# Patient Record
Sex: Male | Born: 1980 | Race: Black or African American | Hispanic: No | Smoking: Never smoker
Health system: Southern US, Community
[De-identification: ages and names within clinical notes are randomized; demographics above are authoritative.]

## PROBLEM LIST (undated history)

## (undated) DIAGNOSIS — K219 Gastro-esophageal reflux disease without esophagitis: Secondary | ICD-10-CM

## (undated) HISTORY — DX: Gastro-esophageal reflux disease without esophagitis: K21.9

---

## 1999-03-30 ENCOUNTER — Encounter: Payer: Self-pay | Admitting: Emergency Medicine

## 1999-03-30 ENCOUNTER — Emergency Department (HOSPITAL_COMMUNITY): Admission: EM | Admit: 1999-03-30 | Discharge: 1999-03-30 | Payer: Self-pay | Admitting: Emergency Medicine

## 2000-07-30 ENCOUNTER — Emergency Department (HOSPITAL_COMMUNITY): Admission: EM | Admit: 2000-07-30 | Discharge: 2000-07-30 | Payer: Self-pay | Admitting: Emergency Medicine

## 2001-01-11 ENCOUNTER — Emergency Department (HOSPITAL_COMMUNITY): Admission: EM | Admit: 2001-01-11 | Discharge: 2001-01-11 | Payer: Self-pay

## 2007-01-18 ENCOUNTER — Emergency Department (HOSPITAL_COMMUNITY): Admission: EM | Admit: 2007-01-18 | Discharge: 2007-01-18 | Payer: Self-pay | Admitting: Family Medicine

## 2007-09-30 ENCOUNTER — Emergency Department (HOSPITAL_COMMUNITY): Admission: EM | Admit: 2007-09-30 | Discharge: 2007-09-30 | Payer: Self-pay | Admitting: Family Medicine

## 2009-06-23 ENCOUNTER — Ambulatory Visit: Payer: Self-pay | Admitting: Internal Medicine

## 2009-06-24 DIAGNOSIS — K219 Gastro-esophageal reflux disease without esophagitis: Secondary | ICD-10-CM

## 2009-06-24 DIAGNOSIS — J45909 Unspecified asthma, uncomplicated: Secondary | ICD-10-CM | POA: Insufficient documentation

## 2009-06-28 ENCOUNTER — Telehealth: Payer: Self-pay | Admitting: Internal Medicine

## 2009-08-02 ENCOUNTER — Ambulatory Visit: Payer: Self-pay | Admitting: Internal Medicine

## 2009-09-14 ENCOUNTER — Encounter: Payer: Self-pay | Admitting: Internal Medicine

## 2009-09-20 ENCOUNTER — Ambulatory Visit: Payer: Self-pay | Admitting: Internal Medicine

## 2009-09-20 DIAGNOSIS — M549 Dorsalgia, unspecified: Secondary | ICD-10-CM | POA: Insufficient documentation

## 2009-10-22 ENCOUNTER — Ambulatory Visit: Payer: Self-pay | Admitting: Internal Medicine

## 2009-10-22 ENCOUNTER — Encounter: Payer: Self-pay | Admitting: Internal Medicine

## 2009-10-25 ENCOUNTER — Telehealth: Payer: Self-pay | Admitting: Internal Medicine

## 2009-10-26 ENCOUNTER — Encounter: Admission: RE | Admit: 2009-10-26 | Discharge: 2009-11-24 | Payer: Self-pay | Admitting: Internal Medicine

## 2009-10-28 ENCOUNTER — Encounter: Payer: Self-pay | Admitting: Internal Medicine

## 2009-11-24 ENCOUNTER — Encounter: Payer: Self-pay | Admitting: Internal Medicine

## 2009-11-26 ENCOUNTER — Ambulatory Visit: Payer: Self-pay | Admitting: Internal Medicine

## 2010-03-18 ENCOUNTER — Emergency Department (HOSPITAL_COMMUNITY): Admission: EM | Admit: 2010-03-18 | Discharge: 2010-03-18 | Payer: Self-pay | Admitting: Emergency Medicine

## 2010-03-21 ENCOUNTER — Telehealth: Payer: Self-pay | Admitting: Internal Medicine

## 2010-03-21 ENCOUNTER — Ambulatory Visit: Payer: Self-pay | Admitting: Internal Medicine

## 2010-03-31 ENCOUNTER — Ambulatory Visit: Payer: Self-pay | Admitting: Internal Medicine

## 2010-04-01 ENCOUNTER — Ambulatory Visit: Payer: Self-pay | Admitting: Internal Medicine

## 2010-04-11 ENCOUNTER — Telehealth (INDEPENDENT_AMBULATORY_CARE_PROVIDER_SITE_OTHER): Payer: Self-pay | Admitting: *Deleted

## 2010-08-08 ENCOUNTER — Ambulatory Visit: Payer: Self-pay | Admitting: Internal Medicine

## 2010-08-08 ENCOUNTER — Telehealth: Payer: Self-pay | Admitting: Internal Medicine

## 2010-09-15 IMAGING — CR DG LUMBAR SPINE COMPLETE 4+V
5 series · 5 of 5 positions shown · non-contrast
Comparison: 09/30/2007.

CLINICAL DATA: 27-year-old male with low back pain.

LUMBAR SPINE - COMPLETE 4+ VIEW

[view not recorded (1 of 5)]
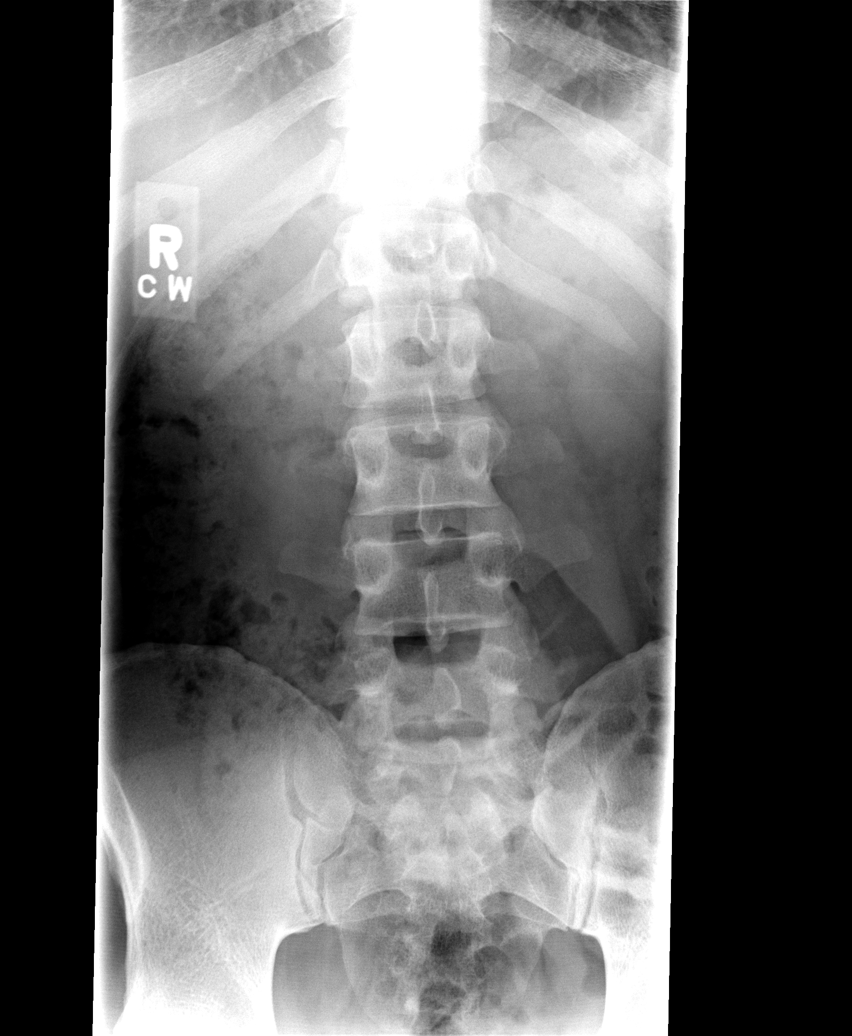

[view not recorded (2 of 5)]
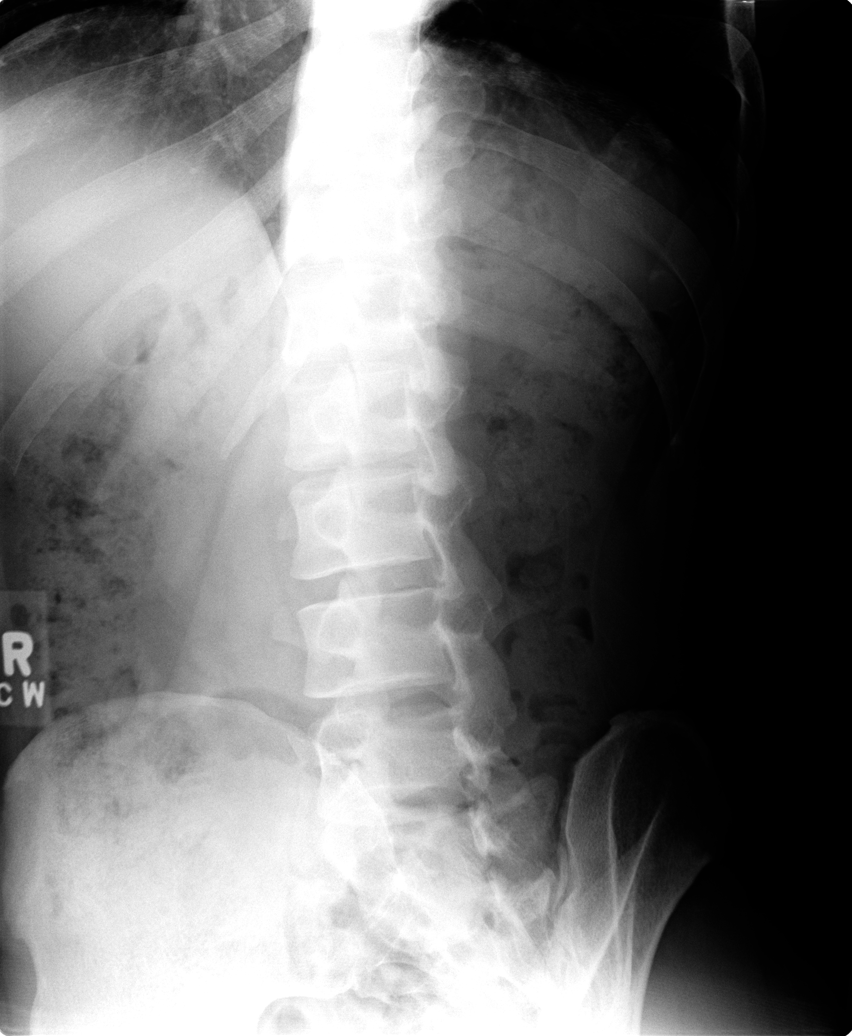

[view not recorded (3 of 5)]
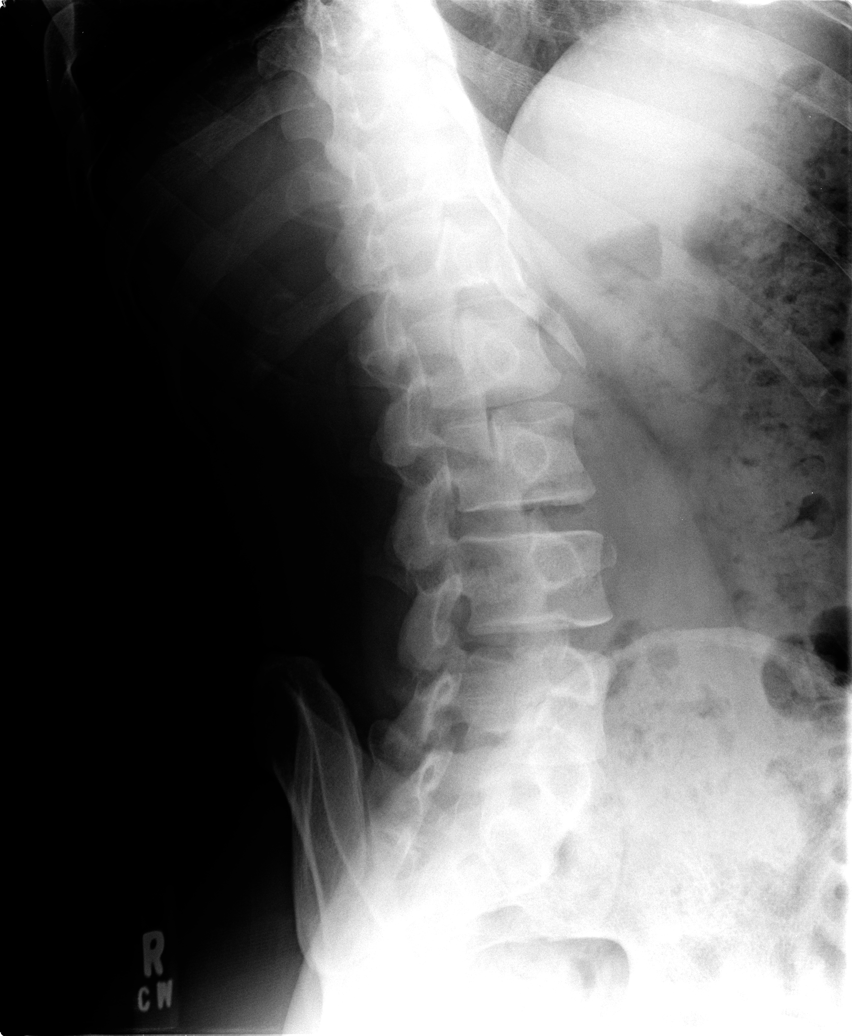

[view not recorded (4 of 5)]
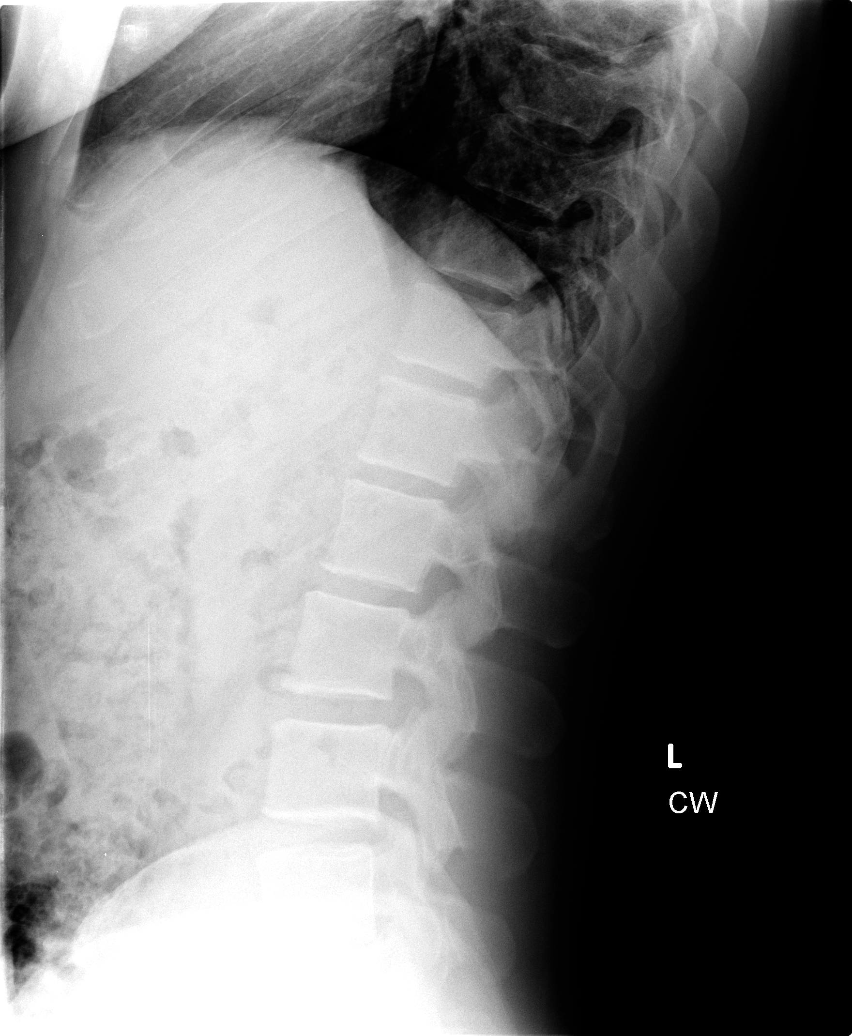

[view not recorded (5 of 5)]
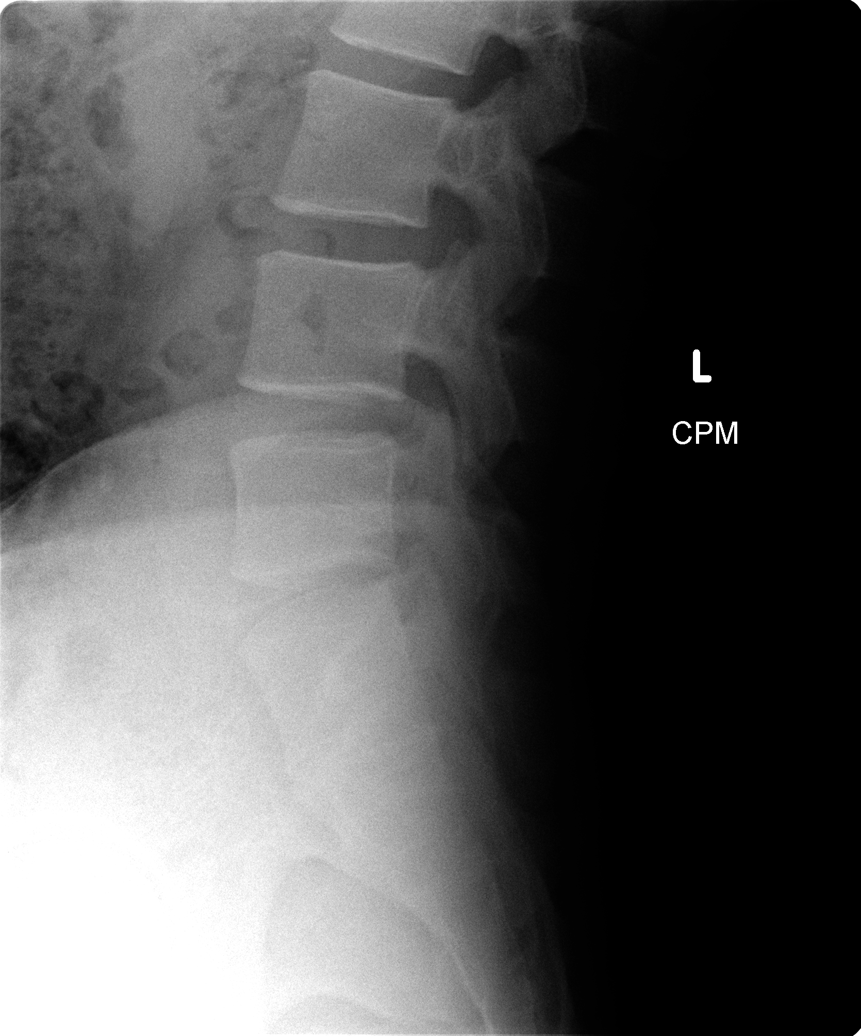

[5 of 5 positions shown; findings below may reference images not displayed]

FINDINGS: Transitional anatomy re-identified, with sacralized L5
level suspected.  Stable vertebral body height and alignment. Bone
mineralization is within normal limits. Stable intervertebral disc
space heights.  No pars fracture.  Visualized sacrum and pelvis
appear intact.
IMPRESSION: 1. No acute osseous abnormality identified in the lumbar spine.
2.  Transitional anatomy re-identified, sacralized L5 level
suspected.

## 2010-11-28 ENCOUNTER — Emergency Department (HOSPITAL_COMMUNITY)
Admission: EM | Admit: 2010-11-28 | Discharge: 2010-11-28 | Payer: Self-pay | Source: Home / Self Care | Admitting: Emergency Medicine

## 2011-01-17 NOTE — Letter (Signed)
Summary: Out of Work  [facility]  [address] [place], [place] [address]   Phone: [phone]  Fax: [phone]    March 21, 2010   Employee:  Robert [name]    To Whom It May Concern:   For Medical reasons, please excuse the above named employee from work for the following dates:  Start:   03/18/10  End:   04/04/10  If you need additional information, please feel free to contact our office.         Sincerely,    Etta Grandchild MD
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
## 2011-01-17 NOTE — Assessment & Plan Note (Signed)
Summary: HURT BACK THIS WEEKEND/[facility]   Vital Signs:  Patient profile:   30 year old male Height:      76 inches Weight:      238 pounds BMI:     29.07 O2 Sat:      97 % on Room air Temp:     98.4 degrees F oral Pulse rate:   75 / minute Pulse rhythm:   regular Resp:     16 per minute BP sitting:   116 / 68  (left arm) Cuff size:   large  Vitals Entered By: Rock Nephew CMA (August 08, 2010 9:35 AM)  Nutrition Counseling: Patient's BMI is greater than 25 and therefore counseled on weight management options.  O2 Flow:  Room air  Primary Care Provider:  Etta Grandchild MD  CC:  Back pain.  History of Present Illness:  Back Pain      This is a 30 year old man who presents with Back pain.  The symptoms began 1 day ago.  The intensity is described as mild.  The patient denies fever, chills, weakness, loss of sensation, fecal incontinence, urinary incontinence, urinary retention, and dysuria.  The pain is located in the left low back.  The pain began at work and gradually.  The pain is made worse by flexion and extension.  The pain is made better by inactivity.  The patient reports inability to work.    Preventive Screening-Counseling & Management  Alcohol-Tobacco     Alcohol drinks/day: <1     Alcohol type: beer     >5/day in last 3 mos: no     Alcohol Counseling: not indicated; use of alcohol is not excessive or problematic     Feels need to cut down: no     Feels annoyed by complaints: no     Feels guilty re: drinking: no     Needs 'eye opener' in am: no     Smoking Status: never  Hep-HIV-STD-Contraception     Hepatitis Risk: no risk noted     HIV Risk: no risk noted     STD Risk: no risk noted      Sexual History:  currently monogamous.        Drug Use:  no.    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 Family History Hypertension  Social History: Reviewed history from 06/23/2009 and no changes required. 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    Physical Exam  General:  Well-developed, well-nourished, in no acute distress; alert and oriented x 3.   Mouth:  Oral mucosa and oropharynx without lesions or exudates.  Teeth in good repair. Neck:  supple, full  ROM, no masses, no carotid bruits, no cervical lymphadenopathy, and no neck tenderness.   Lungs:  Normal respiratory effort, chest expands symmetrically. Lungs are clear to auscultation, no crackles or wheezes. Heart:  Normal rate and regular rhythm. S1 and S2 normal without gallop, murmur, click, rub or other extra sounds. Abdomen:  soft, non-tender, normal bowel sounds, no distention, no masses, no guarding, no rigidity, no rebound tenderness, no hepatomegaly, and no splenomegaly.   Msk:  normal ROM, no joint tenderness, no joint swelling, no joint warmth, no redness over joints, no joint deformities, no joint instability, no crepitation, and no muscle atrophy.   Pulses:  R and L  carotid,radial,femoral,dorsalis pedis and posterior tibial pulses are full and equal bilaterally Extremities:  No clubbing, cyanosis, edema, or deformity noted with normal full range of motion of all joints.   Neurologic:  No cranial nerve deficits noted. Station and gait are normal. Plantar reflexes are down-going bilaterally. DTRs are symmetrical throughout. Sensory, motor and coordinative functions appear intact. Skin:  turgor normal, color normal, no rashes, no suspicious lesions, no ecchymoses, no petechiae, no purpura, no ulcerations, and no edema.   Cervical Nodes:  no anterior cervical adenopathy and no posterior cervical adenopathy.   Psych:  Cognition and judgment appear intact. Alert and cooperative with normal attention span and concentration. No apparent delusions, illusions, hallucinations   Detailed Back/Spine Exam  General:    Well-developed, well-nourished, in no acute distress; alert and oriented x 3.    Gait:    Normal heel-toe gait pattern bilaterally.    Lumbosacral Exam:  Inspection-deformity:    Normal Palpation-spinal tenderness:  Normal Range of Motion:    Forward Flexion:   60 degrees    Hyperextension:   25 degrees    Schober's:        >6 cm    Right Lateral Bend:   25 degrees    Left Lateral Bend:   25 degrees Squatting:  normal Lying Straight Leg Raise:    Right:  negative    Left:  negative Sitting Straight Leg Raise:    Right:  negative    Left:  negative Reverse Straight Leg Raise:    Right:  negative    Left:  negative Contralateral Straight Leg Raise:    Right:  negative    Left:  negative Sciatic Notch:    There is no sciatic notch tenderness. Toe Walking:    Right:  normal    Left:  normal Heel Walking:    Right:  normal    Left:  normal   Impression & Recommendations:  Problem # 1:  BACK PAIN (ICD-724.5) Assessment Unchanged  The following medications were removed from the medication list:    Oxycodone-acetaminophen 5-325 Mg  Tabs (Oxycodone-acetaminophen) .Marland Kitchen... 1-2 every 6hours as needed    Ibuprofen 800 Mg Tabs (Ibuprofen) ..... One by mouth three times a day with food for pain  Patient Instructions: 1)  Please schedule a follow-up appointment in 2 weeks. 2)  Take 650-1000mg  of Tylenol every 4-6 hours as needed for relief of pain or comfort of fever AVOID taking more than 4000mg   in a 24 hour period (can cause liver damage in higher doses). 3)  Take 400-600mg  of Ibuprofen (Advil, Motrin) with food every 4-6 hours as needed for relief of pain or comfort of fever. 4)  Most patients (90%) with low back pain will improve with time (2-6 weeks). Keep active but avoid activities that are painful. Apply moist heat and/or ice  to lower back several times a day.

## 2011-01-17 NOTE — Progress Notes (Signed)
Summary: Call Report  Phone Note Other Incoming   Caller: [facility] Summary of Call: [facility] Triage Call Report Triage Record Num: [id number] Operator: Loraine Leriche Neurohr Patient Name: Robert [name] Call Date & Time: 08/07/2010 3:51:04PM Patient Phone: [phone] PCP: Patient Gender: Male PCP Fax : Patient DOB: 1981/04/22 Practice Name: [facility] Reason for Call: Pt.hurt his back playing basketball, now having lower back pain. (states that the pain is dull and burning.) (Onset around 1330.) Pt was playing basketball and lifting weights today and this helped to exacerbate a chronic problem in this area. See Provider within 24 hours. Protocol(s) Used: Back Symptoms Recommended Outcome per Protocol: See Provider within 24 hours Reason for Outcome: Pain intensifies with coughing, sneezing or straining Care Advice:  ~ Call provider if symptoms worsen or new symptoms develop. Apply a cloth-covered cold or ice pack to the area for 20 minutes 4 to 8 times a day for relief of pain for the first 24-48 hours. After 24 to 48 hours of cold application, use a cloth-covered heat pack to the area for 20 minutes 3 to 4 times a day.  ~ Sleep on a moderately firm mattress. Try sleeping on back with a pillow placed under knees or sleep on side with knees bent and a pillow between knees. When lying on stomach, place pillow under the abdomen and pelvis; do not use a pillow for your head.  ~  ~ SYMPTOM / CONDITION MANAGEMENT  ~ CAUTIONS Analgesic/Antipyretic Advice - Acetaminophen: Consider acetaminophen as directed on label or by pharmacist/provider for pain or fever PRECAUTIONS: - Use if there is no history of liver disease, alcoholism, or intake of three or more alcohol drinks per day - Only if approved by provider during pregnancy or when breastfeeding - During pregnancy, acetaminophen should not be taken more than 3 consecutive days without telling provider - Do not exceed recommended dose  or frequency  ~ Analgesic/Antipyretic Advice - NSAIDs: Consider aspirin, ibuprofen, naproxen or ketoprofen for pain or fever as directed on label or  Initial call taken by: Margaret Pyle, CMA,  August 08, 2010 8:31 AM

## 2011-01-17 NOTE — Letter (Signed)
Summary: [facility]  [address] [place], [place] [address]   Phone: [phone]  Fax: [phone]    Today's Date: April 01, 2010  Name of Patient: Robert [name]  The above named patient had a medical visit today at:   1  pm.  Please take this into consideration when reviewing the time away from work/school.    Special Instructions:  [  ] None  [X]  To be off the remainder of today, returning to the normal work / school schedule on Monday, April 04, 2010 with no restrictions.  [  ] To be off until the next scheduled appointment on ______________________.  [  ] Other ________________________________________________________________ ________________________________________________________________________   Sincerely yours,   Sanda Linger MD

## 2011-01-17 NOTE — Progress Notes (Signed)
Summary: REFILLs  Phone Note Refill Request Call back at Home Phone [phone]   Refills Requested: Medication #1:  OXYCODONE-ACETAMINOPHEN 5-325 MG TABS 1-2 every 6hours as needed  Medication #2:  IBUPROFEN 800 MG TABS One by mouth three times a day with food for pain. Pt last got #20 on friday by the ER and would like a refill.   Initial call taken by: Lamar Sprinkles, CMA,  March 21, 2010 3:32 PM  Follow-up for Phone Call        [facility] rx for ibuprofen also? Follow-up by: Lamar Sprinkles, CMA,  March 21, 2010 3:45 PM  Additional Follow-up for Phone Call Additional follow up Details #1::        sure, but i rx ed that earlier Additional Follow-up by: Etta Grandchild MD,  March 21, 2010 3:55 PM    Additional Follow-up for Phone Call Additional follow up Details #2::    Pt informed to pick up rx  Follow-up by: Lamar Sprinkles, CMA,  March 21, 2010 6:32 PM  Prescriptions: OXYCODONE-ACETAMINOPHEN 5-325 MG TABS (OXYCODONE-ACETAMINOPHEN) 1-2 every 6hours as needed  #25 x 0   Entered and Authorized by:   Etta Grandchild MD   Signed by:   Etta Grandchild MD on 03/21/2010   Method used:   Print then Give to Patient   RxID:   [id number]

## 2011-01-17 NOTE — Progress Notes (Signed)
Summary: [employer] Medical Request Form  Request for records received from [facility] [employer] Disability Management Request Form. Form forward to [employer]. Dena Chavis  April 11, 2010 11:30 AM

## 2011-01-17 NOTE — Letter (Signed)
Summary: Out of Work  [facility]  [address] [place], [place] [address]   Phone: [phone]  Fax: [phone]    August 08, 2010   Employee:  Robert [name]    To Whom It May Concern:   For Medical reasons, please excuse the above named employee from work for the following dates:  Start:   08/08/2010  End:   08/10/2010  If you need additional information, please feel free to contact our office.         Sincerely,    Etta Grandchild MD
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
## 2011-05-29 ENCOUNTER — Other Ambulatory Visit (HOSPITAL_COMMUNITY): Payer: Self-pay | Admitting: Orthodontics and Dentofacial Orthopedics

## 2011-05-29 DIAGNOSIS — R52 Pain, unspecified: Secondary | ICD-10-CM

## 2011-06-05 ENCOUNTER — Inpatient Hospital Stay (HOSPITAL_COMMUNITY): Admission: RE | Admit: 2011-06-05 | Payer: Self-pay | Source: Ambulatory Visit

## 2011-08-22 ENCOUNTER — Ambulatory Visit (INDEPENDENT_AMBULATORY_CARE_PROVIDER_SITE_OTHER): Payer: 59 | Admitting: Internal Medicine

## 2011-08-22 VITALS — BP 114/80 | HR 72 | Temp 98.4°F | Resp 16 | Wt 236.0 lb

## 2011-08-22 DIAGNOSIS — Z23 Encounter for immunization: Secondary | ICD-10-CM

## 2011-08-22 DIAGNOSIS — J019 Acute sinusitis, unspecified: Secondary | ICD-10-CM

## 2011-08-22 MED ORDER — CETIRIZINE-PSEUDOEPHEDRINE ER 5-120 MG PO TB12: 1.0000 | ORAL_TABLET | Freq: Two times a day (BID) | ORAL | Status: AC

## 2011-08-22 NOTE — Progress Notes (Signed)
  Subjective:    Patient ID: Robert [name], male    DOB: 06/27/1981, 30 y.o.   MRN: [id number]  URI  This is a new problem. The current episode started yesterday. There has been no fever. Associated symptoms include congestion, rhinorrhea, sinus pain and sneezing. Pertinent negatives include no abdominal pain, chest pain, coughing, diarrhea, dysuria, ear pain, headaches, joint pain, joint swelling, nausea, neck pain, plugged ear sensation, rash, sore throat, swollen glands, vomiting or wheezing. He has tried nothing for the symptoms.      Review of Systems  Constitutional: Positive for chills. Negative for fever, diaphoresis, activity change, appetite change, fatigue and unexpected weight change.  HENT: Positive for congestion, rhinorrhea, sneezing, postnasal drip and sinus pressure. Negative for hearing loss, ear pain, nosebleeds, sore throat, facial swelling, drooling, mouth sores, trouble swallowing, neck pain, neck stiffness, dental problem, voice change, tinnitus and ear discharge.   Eyes: Negative.   Respiratory: Negative for apnea, cough, choking, chest tightness, shortness of breath, wheezing and stridor.   Cardiovascular: Negative for chest pain, palpitations and leg swelling.  Gastrointestinal: Negative for nausea, vomiting, abdominal pain and diarrhea.  Genitourinary: Negative.  Negative for dysuria.  Musculoskeletal: Negative for myalgias, back pain, joint pain, arthralgias and gait problem.  Skin: Negative for color change, pallor, rash and wound.  Neurological: Negative.  Negative for headaches.  Hematological: Negative for adenopathy. Does not bruise/bleed easily.  Psychiatric/Behavioral: Negative.        Objective:   Physical Exam  Vitals reviewed. Constitutional: He is oriented to person, place, and time. He appears well-developed and well-nourished. No distress.  HENT:  Head: No trismus in the jaw.  Right Ear: Hearing, tympanic membrane, external ear and ear canal  normal.  Left Ear: Hearing, tympanic membrane, external ear and ear canal normal.  Nose: Mucosal edema present. No rhinorrhea, nose lacerations, sinus tenderness, nasal deformity, septal deviation or nasal septal hematoma. No epistaxis.  No foreign bodies. Right sinus exhibits no maxillary sinus tenderness and no frontal sinus tenderness. Left sinus exhibits no maxillary sinus tenderness and no frontal sinus tenderness.  Mouth/Throat: Oropharynx is clear and moist and mucous membranes are normal. Mucous membranes are not pale, not dry and not cyanotic. No uvula swelling. No oropharyngeal exudate, posterior oropharyngeal edema, posterior oropharyngeal erythema or tonsillar abscesses.  Eyes: Conjunctivae are normal. Right eye exhibits no discharge. Left eye exhibits no discharge. No scleral icterus.  Neck: Normal range of motion. Neck supple. No JVD present. No tracheal deviation present. No thyromegaly present.  Cardiovascular: Normal rate, regular rhythm, normal heart sounds and intact distal pulses.  Exam reveals no gallop and no friction rub.   No murmur heard. Pulmonary/Chest: Effort normal and breath sounds normal. No stridor. No respiratory distress. He has no wheezes. He has no rales. He exhibits no tenderness.  Abdominal: Soft. Bowel sounds are normal. He exhibits no distension and no mass. There is no tenderness. There is no rebound and no guarding.  Musculoskeletal: Normal range of motion. He exhibits no edema and no tenderness.  Lymphadenopathy:    He has no cervical adenopathy.  Neurological: He is oriented to person, place, and time. He displays normal reflexes. He exhibits normal muscle tone.  Skin: Skin is warm and dry. No rash noted. He is not diaphoretic. No erythema. No pallor.  Psychiatric: He has a normal mood and affect. His behavior is normal. Judgment and thought content normal.          Assessment & Plan:

## 2011-08-22 NOTE — Patient Instructions (Signed)
Common Cold, Adult An upper respiratory tract infection, or cold, is a viral infection of the air passages to the lung. Colds are contagious, especially during the first 3 or 4 days. Antibiotics cannot cure a cold. Cold germs are spread by coughs, sneezes, and hand to hand contact. A respiratory tract infection usually clears up in a few days, but some people may be sick for a week or two. HOME CARE INSTRUCTIONS  Only take over-the-counter or prescription medicines for pain, discomfort, or fever as directed by your caregiver.   Be careful not to blow your nose too hard. This may cause a nosebleed.   Use a cool-mist humidifier (vaporizer) to increase air moisture. This will make it easier for you to breath. Do not use hot steam.   Rest as much as possible and get plenty of sleep.   Wash your hands often, especially after you blow your nose. Cover your mouth and nose with a tissue when you sneeze or cough.   Drink at least 8 glasses of clear liquids every day, such as water, fruit juices, tea, clear soups, and carbonated beverages.  SEEK MEDICAL CARE IF:  An oral temperature above 100.5 lasts 4 days or more, and is not controlled by medication.   You have a sore throat that gets worse or you see white or yellow spots in your throat.   Your cough gets worse or lasts more than 10 days.   You have a rash somewhere on your skin. You have large and tender lumps in your neck.   You have an earache or a headache.   You have thick, greenish or yellowish discharge from your nose.   You cough-up thick yellow, green, gray or bloody mucus (secretions).  SEEK IMMEDIATE MEDICAL CARE IF: You have trouble breathing, chest pain, or your skin or nails look gray or blue. MAKE SURE YOU:   Understand these instructions.   Will watch your condition.   Will get help right away if you are not doing well or get worse.  Document Released: 12/01/2000 Document Re-Released: 11/16/2008 [facility] Patient  Information 2011 [place], [place].

## 2011-08-23 DIAGNOSIS — J019 Acute sinusitis, unspecified: Secondary | ICD-10-CM | POA: Insufficient documentation

## 2011-08-23 NOTE — Assessment & Plan Note (Signed)
This is a viral URI so will treat the symptoms but antibiotics are not indicated

## 2013-03-21 ENCOUNTER — Encounter: Payer: 59 | Admitting: Internal Medicine

## 2013-03-21 DIAGNOSIS — Z0289 Encounter for other administrative examinations: Secondary | ICD-10-CM

## 2014-01-02 ENCOUNTER — Ambulatory Visit (INDEPENDENT_AMBULATORY_CARE_PROVIDER_SITE_OTHER): Payer: Managed Care, Other (non HMO) | Admitting: Internal Medicine

## 2014-01-02 ENCOUNTER — Other Ambulatory Visit (INDEPENDENT_AMBULATORY_CARE_PROVIDER_SITE_OTHER): Payer: Managed Care, Other (non HMO)

## 2014-01-02 ENCOUNTER — Encounter: Payer: Self-pay | Admitting: Internal Medicine

## 2014-01-02 VITALS — BP 122/88 | HR 69 | Temp 98.2°F | Resp 16 | Ht 76.0 in | Wt 227.6 lb

## 2014-01-02 DIAGNOSIS — Z0000 Encounter for general adult medical examination without abnormal findings: Secondary | ICD-10-CM | POA: Insufficient documentation

## 2014-01-02 DIAGNOSIS — G2581 Restless legs syndrome: Secondary | ICD-10-CM

## 2014-01-02 DIAGNOSIS — G253 Myoclonus: Secondary | ICD-10-CM | POA: Insufficient documentation

## 2014-01-02 LAB — COMPREHENSIVE METABOLIC PANEL
ALBUMIN: 4.2 g/dL (ref 3.5–5.2)
ALK PHOS: 54 U/L (ref 39–117)
ALT: 26 U/L (ref 0–53)
AST: 22 U/L (ref 0–37)
BUN: 16 mg/dL (ref 6–23)
CO2: 27 meq/L (ref 19–32)
Calcium: 9.2 mg/dL (ref 8.4–10.5)
Chloride: 105 mEq/L (ref 96–112)
Creatinine, Ser: 1.3 mg/dL (ref 0.4–1.5)
GFR: 83.43 mL/min (ref >60.00)
GLUCOSE: 125 mg/dL — AB (ref 70–99)
POTASSIUM: 3.8 meq/L (ref 3.5–5.1)
Sodium: 138 mEq/L (ref 135–145)
TOTAL PROTEIN: 7.2 g/dL (ref 6.0–8.3)
Total Bilirubin: 0.6 mg/dL (ref 0.3–1.2)

## 2014-01-02 LAB — URINALYSIS, ROUTINE W REFLEX MICROSCOPIC
BILIRUBIN URINE: NEGATIVE
Hgb urine dipstick: NEGATIVE
Ketones, ur: NEGATIVE
LEUKOCYTES UA: NEGATIVE
NITRITE: NEGATIVE
PH: 5.5 (ref 5.0–8.0)
Specific Gravity, Urine: >=1.03 — AB (ref 1.000–1.030)
Total Protein, Urine: NEGATIVE
Urine Glucose: NEGATIVE
Urobilinogen, UA: 0.2 (ref 0.0–1.0)

## 2014-01-02 LAB — TSH: TSH: 1.58 u[IU]/mL (ref 0.35–5.50)

## 2014-01-02 LAB — CBC WITH DIFFERENTIAL/PLATELET
Basophils Absolute: 0 10*3/uL (ref 0.0–0.1)
Basophils Relative: 0.8 % (ref 0.0–3.0)
EOS PCT: 4.3 % (ref 0.0–5.0)
Eosinophils Absolute: 0.2 10*3/uL (ref 0.0–0.7)
HCT: 43.7 % (ref 39.0–52.0)
Hemoglobin: 14.8 g/dL (ref 13.0–17.0)
Lymphocytes Relative: 47.8 % — ABNORMAL HIGH (ref 12.0–46.0)
Lymphs Abs: 2.5 10*3/uL (ref 0.7–4.0)
MCHC: 33.8 g/dL (ref 30.0–36.0)
MCV: 87.4 fl (ref 78.0–100.0)
MONO ABS: 0.4 10*3/uL (ref 0.1–1.0)
Monocytes Relative: 8.4 % (ref 3.0–12.0)
NEUTROS PCT: 38.7 % — AB (ref 43.0–77.0)
Neutro Abs: 2 10*3/uL (ref 1.4–7.7)
Platelets: 174 10*3/uL (ref 150.0–400.0)
RBC: 5 Mil/uL (ref 4.22–5.81)
RDW: 13.4 % (ref 11.5–14.6)
WBC: 5.2 10*3/uL (ref 4.5–10.5)

## 2014-01-02 NOTE — Progress Notes (Signed)
Pre visit review using our clinic review tool, if applicable. No additional management support is needed unless otherwise documented below in the visit note. 

## 2014-01-02 NOTE — Patient Instructions (Signed)

## 2014-01-04 ENCOUNTER — Encounter: Payer: Self-pay | Admitting: Internal Medicine

## 2014-01-04 NOTE — Progress Notes (Signed)
   Subjective:    Patient ID: Robert [name], male    DOB: 07/30/1981, 33 y.o.   MRN: [id number]  HPI  He returns for a physical and he tells me that his girlfriend complains that he moves and thrashes in the bed at night.  Review of Systems  Constitutional: Negative.   HENT: Negative.   Eyes: Negative.   Respiratory: Negative.  Negative for apnea, cough, choking, chest tightness, shortness of breath, wheezing and stridor.   Cardiovascular: Negative.  Negative for chest pain, palpitations and leg swelling.  Gastrointestinal: Negative.  Negative for nausea, vomiting, abdominal pain, diarrhea, constipation and blood in stool.  Endocrine: Negative.   Genitourinary: Negative.   Musculoskeletal: Negative.   Skin: Negative.   Allergic/Immunologic: Negative.   Neurological: Negative.   Hematological: Negative.  Negative for adenopathy. Does not bruise/bleed easily.  Psychiatric/Behavioral: Negative.   All other systems reviewed and are negative.       Objective:   Physical Exam  Vitals reviewed. Constitutional: He is oriented to person, place, and time. He appears well-developed and well-nourished. No distress.  HENT:  Head: Normocephalic and atraumatic.  Mouth/Throat: Oropharynx is clear and moist. No oropharyngeal exudate.  Eyes: Conjunctivae are normal. Right eye exhibits no discharge. Left eye exhibits no discharge. No scleral icterus.  Neck: Normal range of motion. Neck supple. No JVD present. No tracheal deviation present. No thyromegaly present.  Cardiovascular: Normal rate, regular rhythm, normal heart sounds and intact distal pulses.  Exam reveals no gallop and no friction rub.   No murmur heard. Pulmonary/Chest: Effort normal and breath sounds normal. No stridor. No respiratory distress. He has no wheezes. He has no rales. He exhibits no tenderness.  Abdominal: Soft. Bowel sounds are normal. He exhibits no distension and no mass. There is no tenderness. There is no rebound  and no guarding. Hernia confirmed negative in the right inguinal area and confirmed negative in the left inguinal area.  Genitourinary: Testes normal and penis normal. Right testis shows no mass, no swelling and no tenderness. Right testis is descended. Left testis shows no mass, no swelling and no tenderness. Left testis is descended. Circumcised. No penile erythema or penile tenderness. No discharge found.  Musculoskeletal: Normal range of motion. He exhibits no edema and no tenderness.  Lymphadenopathy:    He has no cervical adenopathy.       Right: No inguinal adenopathy present.       Left: No inguinal adenopathy present.  Neurological: He is alert and oriented to person, place, and time. He has normal reflexes. He displays normal reflexes. No cranial nerve deficit. He exhibits normal muscle tone. Coordination normal.  Skin: Skin is warm and dry. No rash noted. He is not diaphoretic. No erythema. No pallor.  Psychiatric: He has a normal mood and affect. His behavior is normal. Judgment and thought content normal.      Lab Results  Component Value Date   WBC 5.2 01/02/2014   HGB 14.8 01/02/2014   HCT 43.7 01/02/2014   PLT 174.0 01/02/2014   GLUCOSE 125* 01/02/2014   ALT 26 01/02/2014   AST 22 01/02/2014   NA 138 01/02/2014   K 3.8 01/02/2014   CL 105 01/02/2014   CREATININE 1.3 01/02/2014   BUN 16 01/02/2014   CO2 27 01/02/2014   TSH 1.58 01/02/2014      Assessment & Plan:

## 2014-01-04 NOTE — Assessment & Plan Note (Signed)
Exam done Vaccines were updated Labs ordered Pt ed material was given 

## 2014-01-04 NOTE — Assessment & Plan Note (Signed)
I have asked him to be evaluated by sleep medicine 

## 2014-01-05 ENCOUNTER — Ambulatory Visit (INDEPENDENT_AMBULATORY_CARE_PROVIDER_SITE_OTHER): Payer: Managed Care, Other (non HMO) | Admitting: Pulmonary Disease

## 2014-01-05 ENCOUNTER — Encounter: Payer: Self-pay | Admitting: Pulmonary Disease

## 2014-01-05 VITALS — BP 138/92 | HR 82 | Temp 98.2°F | Ht 76.0 in | Wt 228.0 lb

## 2014-01-05 DIAGNOSIS — G4733 Obstructive sleep apnea (adult) (pediatric): Secondary | ICD-10-CM

## 2014-01-05 NOTE — Assessment & Plan Note (Addendum)
Given excessive daytime somnolence, narrow pharyngeal exam, witnessed apneas & loud snoring, obstructive sleep apnea is very likely & an overnight polysomnogram will be scheduled as a split study. The pathophysiology of obstructive sleep apnea , it's cardiovascular consequences & modes of treatment including CPAP were discused with the patient in detail & they evidenced understanding. Will also study leg movements -hence in lab study -it is possible that the leg movements described may be due to sleep-disordered breathing and not primary limb movement disorder

## 2014-01-05 NOTE — Patient Instructions (Signed)
Sleep study You may have obstructive sleep apnea or you may simply have leg movements during sleep

## 2014-01-05 NOTE — Progress Notes (Signed)
Subjective:    Patient ID: Robert [name], male    DOB: 1981-09-02, 33 y.o.   MRN: [id number]  HPI  33 year old referred for evaluation of leg movements and non-refreshing sleep. His girlfriend has noted jerking movements during sleep also the night. Loud snoring has also been noted which decreases when he sleeps prone. 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 Does he does not have any fixed sleep habits.   Bedtime is variable, sleep latency is minimal, he sleeps without any favorable position on one to 2 pillows, he is 2-3 awakenings without any post void sleep latency and is out of bed feeling tired with occasional dryness of mouth. He is gained about 10 pounds in the last 2 years.  He denies smoking or alcohol use, he is divorced and has 3 children youngest is 9  There is no history suggestive of cataplexy, sleep paralysis or parasomnias. He denies creepy crawly sensations in his legs or urge to move his legs  Past Medical History  Diagnosis Date  . Asthma   . GERD (gastroesophageal reflux disease)     No past surgical history on file.  No Known Allergies  History   Social History  . Marital Status: Married    Spouse Name: N/A    Number of Children: N/A  . Years of Education: N/A   Occupational History  . Optician, dispensing    Social History Main Topics  . Smoking status: Never Smoker   . Smokeless tobacco: Never Used  . Alcohol Use: No  . Drug Use: No  . Sexual Activity: Not Currently   Other Topics Concern  . Not on file   Social History Narrative   Regular exercise-Yes    Family History  Problem Relation Age of Onset  . Hypertension Other   . Asthma Son   . Cancer Neg Hx   . Diabetes Neg Hx   . Early death Neg Hx   . Heart disease Neg Hx   . Hyperlipidemia Neg Hx   . Kidney disease Neg Hx   . Stroke Neg Hx       Review of  Systems  Constitutional: Negative for fever, chills, diaphoresis, activity change, appetite change, fatigue and unexpected weight change.  HENT: Negative for congestion, dental problem, ear discharge, ear pain, facial swelling, hearing loss, mouth sores, nosebleeds, postnasal drip, rhinorrhea, sinus pressure, sneezing, sore throat, tinnitus, trouble swallowing and voice change.   Eyes: Negative for photophobia, discharge, itching and visual disturbance.  Respiratory: Negative for apnea, cough, choking, chest tightness, shortness of breath, wheezing and stridor.   Cardiovascular: Positive for palpitations. Negative for chest pain and leg swelling.  Gastrointestinal: Negative for nausea, vomiting, abdominal pain, constipation, blood in stool and abdominal distention.  Genitourinary: Negative for dysuria, urgency, frequency, hematuria, flank pain, decreased urine volume and difficulty urinating.  Musculoskeletal: Negative for arthralgias, back pain, gait problem, joint swelling, myalgias, neck pain and neck stiffness.  Skin: Negative for color change, pallor and rash.  Neurological: Positive for headaches. Negative for dizziness, tremors, seizures, syncope, speech difficulty, weakness, light-headedness and numbness.  Hematological: Negative for adenopathy. Does not bruise/bleed easily.  Psychiatric/Behavioral: Positive for sleep disturbance. Negative for confusion and agitation. The patient is not nervous/anxious.        Objective:   Physical Exam  Gen. Pleasant, well-nourished, in no distress, normal affect  ENT - no lesions, no post nasal drip Neck: No JVD, no thyromegaly, no carotid bruits Lungs: no use of accessory muscles, no dullness to percussion, clear without rales or rhonchi  Cardiovascular: Rhythm regular, heart sounds  normal, no murmurs or gallops, no peripheral edema Abdomen: soft and non-tender, no hepatosplenomegaly, BS normal. Musculoskeletal: No deformities, no cyanosis or  clubbing Neuro:  alert, non focal       Assessment & Plan:

## 2014-02-01 ENCOUNTER — Ambulatory Visit (HOSPITAL_BASED_OUTPATIENT_CLINIC_OR_DEPARTMENT_OTHER): Payer: Managed Care, Other (non HMO) | Attending: Pulmonary Disease

## 2014-02-01 VITALS — Ht 76.0 in | Wt 230.0 lb

## 2014-02-01 DIAGNOSIS — G4761 Periodic limb movement disorder: Secondary | ICD-10-CM | POA: Insufficient documentation

## 2014-02-01 DIAGNOSIS — G4733 Obstructive sleep apnea (adult) (pediatric): Secondary | ICD-10-CM

## 2014-02-03 ENCOUNTER — Telehealth: Payer: Self-pay | Admitting: Pulmonary Disease

## 2014-02-03 DIAGNOSIS — G4733 Obstructive sleep apnea (adult) (pediatric): Secondary | ICD-10-CM

## 2014-02-03 NOTE — Sleep Study (Signed)
[facility]  NAME: Robert [name]  DATE OF BIRTH: 04/26/1981  MEDICAL RECORD NUMBER [id number]  LOCATION: [facility]  PHYSICIAN: ALVA,RAKESH V.  DATE OF STUDY: 02/01/14  SLEEP STUDY TYPE: Split Polysomnogram               REFERRING PHYSICIAN: Oretha MilchAlva, Rakesh V, MD  INDICATION FOR STUDY: 33 year old referred for evaluation of leg movements and non-refreshing sleep with loud snoring. At the time of this study ,they weighed 230  pounds with a height of  6 ft 4 inches and the BMI of 28, neck size of 16 inches. Epworth sleepiness score was 14   This intervention polysomnogram was performed with a sleep technologist in attendance. EEG, EOG,EMG and respiratory parameters recorded. Sleep stages, arousals, limb movements and respiratory data was scored according to criteria laid out by the American Academy of sleep medicine.  SLEEP ARCHITECTURE: Lights out was at 22-52 PM and lights on was at 1-38 AM. During the baseline portion ,total sleep time was 125 minutes with sleep latency of 20 minutes with latency to REM sleep of 35 minutes and wake after sleep onset of 20 minutes. Sleep efficiency was poor at 75 with frequent long periods of awakening. Sleep stages as a percentage of total sleep time was N1 -17%,N2- 80% and REM sleep 2.8% ( 3.5 minutes) . During titration REM sleep accounted for 30 minutes and supine sleep was noted .The longest period of REM sleep was around 2-30 AM.   AROUSAL DATA : At baseline ,there were 77 arousals with an arousal index of 37 events per hour. Of these 30 were spontaneous, and 47 were associated with respiratory events and 0 were associated periodic limb movements. During titration, the arousal index was 9  events per hour  RESPIRATORY DATA: During the baseline portion,there were 52 obstructive apneas, 0 central apneas, 0 mixed apneas and 21 hypopneas with apnea -hypopnea index of 35 events per hour.  There was no relation to sleep  stage or body position. Due to this degree of respiratory disturbance CPAP was initiated at 4 cm and titrated to find a level of 9 cm due to persistent events during supine sleep. At the level of 9 cm for 29 minutes of sleep, 0 apneas and 0 hypopneas is were noted. Titration was optimal -REM supine sleep was noted  MOVEMENT/PARASOMNIA: There were 0 PLMS with a PLM index of 0 events per hour. The PLM arousal index was 0 events per hour. PLMS seemed to improve during titration.  OXYGEN DATA: The lowest desaturation was 85% during non-REM sleep and the desaturation index was 31 per hour. This was corrected by titration and the saturations stayed below 88% for 0.3 minutes during titration.  CARDIAC DATA: The low heart rate was 33 beats per minute. The high heart rate recorded was an artifact. No arrhythmias were noted   IMPRESSION :  1. Severe obstructive sleep apnea with hypopneas causing sleep fragmentation and mild oxygen desaturation. 2. This was corrected by CPAP of 9 centimeters with large nasal mask. Titration was optimal 3. No evidence of cardiac arrhythmias or behavioral disturbance during sleep. 4. Periodic limb movements were not noted.  RECOMMENDATION:    1. The treatment options for this degree of sleep disordered breathing includes weight loss and CPAP therapy. 2. CPAP can be initiated at 9 centimeters with a large nasal mask and compliance monitored at this level. 3. Patient should be cautioned against driving when sleepy.They should be  asked to avoid medications with sedative side effects  ALVA,RAKESH V. Diplomate, Biomedical engineer of Sleep Medicine  ELECTRONICALLY SIGNED ON:  11/18/2013, 1:24 PM  [facility] PH: [phone]   FX: [phone] ACCREDITED BY THE AMERICAN ACADEMY OF SLEEP MEDICINE

## 2014-02-03 NOTE — Telephone Encounter (Signed)
PSGshowed - 1. Severe obstructive sleep apnea with hypopneas  -AHi 34/h 2. This was corrected by CPAP of 9 centimeters with large nasal mask. Titration was optimal  Periodic limb movements were not noted.  Order for CPAP has been sent

## 2014-02-06 NOTE — Telephone Encounter (Signed)
lmomtcb x1 

## 2014-02-11 NOTE — Telephone Encounter (Signed)
Pt returned call. Please call back at [phone]

## 2014-02-11 NOTE — Telephone Encounter (Signed)
Pt returning call to Robert [name].Caren GriffinsStanley A Dalton

## 2014-02-11 NOTE — Telephone Encounter (Signed)
lmomtcb x1 

## 2014-02-11 NOTE — Telephone Encounter (Signed)
lmomtcb x 2  

## 2014-02-13 NOTE — Telephone Encounter (Signed)
Spoke with the pt and notified of recs per RA  He verbalized understanding  Nothing further needed 

## 2014-02-13 NOTE — Telephone Encounter (Signed)
Pt returned call

## 2014-04-21 ENCOUNTER — Telehealth: Payer: Self-pay | Admitting: *Deleted

## 2014-04-21 NOTE — Telephone Encounter (Signed)
Per RA: Poor compliance Needs to increase usage Can we help?

## 2014-04-22 NOTE — Telephone Encounter (Signed)
Called spoke with pt. He reports his work schedule is so irregular. He is trying to get back on schedule and wear this when he can. He rpeorts insurance already called him and made aware he needs to be more compliant. Nothing further needed

## 2014-06-18 ENCOUNTER — Telehealth: Payer: Self-pay | Admitting: Pulmonary Disease

## 2014-06-18 DIAGNOSIS — G4733 Obstructive sleep apnea (adult) (pediatric): Secondary | ICD-10-CM

## 2014-06-18 NOTE — Telephone Encounter (Signed)
Download on 9 cm 5/8-05/23/14 -  AHI 6/h usage remains poor Needs Ov with TP Pl le thim know CPAP will be taken away unless he uses at least 4h/night

## 2014-06-18 NOTE — Telephone Encounter (Signed)
lmomtcb x1 

## 2014-06-23 NOTE — Telephone Encounter (Signed)
Pt returning call.Robert [name] ° °

## 2014-06-23 NOTE — Telephone Encounter (Signed)
Pt is aware and appt scheduled for 07/27/14.

## 2014-06-23 NOTE — Telephone Encounter (Signed)
He has not been seen for  A post CPAP visit Needs OV with another download in 1 month-  If no better , will dc CPAP

## 2014-06-23 NOTE — Telephone Encounter (Signed)
Called spoke with pt. He is aware of results. He did not want to come in for OV. He reports the reason he is not able to wear it like he is suppose to is bc of his work schedule. He reports some days he works 20 hrs in a day. He reports he is trying the best he can and will start wearing 4 hrs per night at least when he can. Will make RA aware

## 2014-06-23 NOTE — Telephone Encounter (Signed)
Called spoke with pt. He was claling to let me know on wed he is not able to wear the machine. He will work from 8am-4PM and then 5pm-5:30 AM. I advised I will make Dr. Vassie LollAlva aware

## 2014-07-09 ENCOUNTER — Telehealth: Payer: Self-pay | Admitting: Pulmonary Disease

## 2014-07-09 NOTE — Telephone Encounter (Signed)
lmomtcb x1 for pt 

## 2014-07-09 NOTE — Telephone Encounter (Signed)
Download 06/22/14 - he is not using his CPAP at all PL let DME know Let him know that cpap will be discontinued

## 2014-07-10 NOTE — Telephone Encounter (Signed)
lmomtcb x 2  

## 2014-07-13 NOTE — Telephone Encounter (Signed)
lmomtcb x 3  

## 2014-07-14 NOTE — Telephone Encounter (Signed)
lmtcb x4. Pt has pending appt 07/27/14. Will make RA aware

## 2014-07-27 ENCOUNTER — Encounter: Payer: Self-pay | Admitting: Pulmonary Disease

## 2014-07-27 ENCOUNTER — Ambulatory Visit (INDEPENDENT_AMBULATORY_CARE_PROVIDER_SITE_OTHER): Payer: Managed Care, Other (non HMO) | Admitting: Pulmonary Disease

## 2014-07-27 VITALS — BP 130/70 | HR 75 | Temp 97.1°F | Ht 72.0 in | Wt 228.0 lb

## 2014-07-27 DIAGNOSIS — G4733 Obstructive sleep apnea (adult) (pediatric): Secondary | ICD-10-CM

## 2014-07-27 NOTE — Progress Notes (Signed)
   Subjective:    Patient ID: Robert [name], male    DOB: 04/28/1981, 33 y.o.   MRN: [id number]  HPI  33 year old referred for FU of OSA    07/27/2014  Post CPAP visit  Chief Complaint  Patient presents with  . Follow-up    Pt still not able to wear CPAP everynight d/t work situation.     PSG -Severe obstructive sleep apnea with hypopneas -AHi 34/h -corrected by CPAP of 9 centimeters with large nasal mask. Titration was optimal  Periodic limb movements were not noted. Download 03/2014 -poor compliance He will work from 8am-4PM and then 5pm-5:30 AM. 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 He does not have any fixed sleep habits.   Review of Systems neg for any significant sore throat, dysphagia, itching, sneezing, nasal congestion or excess/ purulent secretions, fever, chills, sweats, unintended wt loss, pleuritic or exertional cp, hempoptysis, orthopnea pnd or change in chronic leg swelling. Also denies presyncope, palpitations, heartburn, abdominal pain, nausea, vomiting, diarrhea or change in bowel or urinary habits, dysuria,hematuria, rash, arthralgias, visual complaints, headache, numbness weakness or ataxia.     Objective:   Physical Exam  Gen. Pleasant, well built, in no distress ENT - no lesions, no post nasal drip Neck: No JVD, no thyromegaly, no carotid bruits Lungs: no use of accessory muscles, no dullness to percussion, decreased without rales or rhonchi  Cardiovascular: Rhythm regular, heart sounds  normal, no murmurs or gallops, no peripheral edema Musculoskeletal: No deformities, no cyanosis or clubbing , no tremors        Assessment & Plan:

## 2014-07-27 NOTE — Patient Instructions (Signed)
Expectation is usage at least 4 hrs every night or at least 70% of the time (7/10 nights) [facility] - Report back in 2 weeks if this is working for you We will check download in 1 month

## 2014-07-27 NOTE — Assessment & Plan Note (Signed)
Expectation is usage at least 4 hrs every night or at least 70% of the time (7/10 nights) Large nasal mask [facility] - Report back in 2 weeks if this is working for you We will check download in 1 month  Weight loss encouraged, compliance with goal of at least 4-6 hrs every night is the expectation. Advised against medications with sedative side effects Cautioned against driving when sleepy - understanding that sleepiness will vary on a day to day basis

## 2014-08-06 ENCOUNTER — Ambulatory Visit: Payer: Managed Care, Other (non HMO) | Admitting: Internal Medicine

## 2014-08-17 ENCOUNTER — Ambulatory Visit (INDEPENDENT_AMBULATORY_CARE_PROVIDER_SITE_OTHER): Payer: Managed Care, Other (non HMO) | Admitting: Internal Medicine

## 2014-08-17 ENCOUNTER — Encounter: Payer: Self-pay | Admitting: Internal Medicine

## 2014-08-17 ENCOUNTER — Other Ambulatory Visit (INDEPENDENT_AMBULATORY_CARE_PROVIDER_SITE_OTHER): Payer: Managed Care, Other (non HMO)

## 2014-08-17 VITALS — BP 118/70 | HR 74 | Temp 98.7°F | Resp 16 | Ht 72.0 in | Wt 228.0 lb

## 2014-08-17 DIAGNOSIS — E118 Type 2 diabetes mellitus with unspecified complications: Secondary | ICD-10-CM | POA: Insufficient documentation

## 2014-08-17 DIAGNOSIS — R7309 Other abnormal glucose: Secondary | ICD-10-CM

## 2014-08-17 DIAGNOSIS — K219 Gastro-esophageal reflux disease without esophagitis: Secondary | ICD-10-CM

## 2014-08-17 DIAGNOSIS — Z0000 Encounter for general adult medical examination without abnormal findings: Secondary | ICD-10-CM

## 2014-08-17 DIAGNOSIS — S39840A Fracture of corpus cavernosum penis, initial encounter: Secondary | ICD-10-CM

## 2014-08-17 LAB — URINALYSIS, ROUTINE W REFLEX MICROSCOPIC
Bilirubin Urine: NEGATIVE
Hgb urine dipstick: NEGATIVE
Ketones, ur: NEGATIVE
Leukocytes, UA: NEGATIVE
Nitrite: NEGATIVE
PH: 6 (ref 5.0–8.0)
RBC / HPF: NONE SEEN (ref 0–?)
SPECIFIC GRAVITY, URINE: 1.015 (ref 1.000–1.030)
Total Protein, Urine: NEGATIVE
URINE GLUCOSE: NEGATIVE
Urobilinogen, UA: 1 (ref 0.0–1.0)

## 2014-08-17 LAB — COMPREHENSIVE METABOLIC PANEL
ALK PHOS: 51 U/L (ref 39–117)
ALT: 16 U/L (ref 0–53)
AST: 20 U/L (ref 0–37)
Albumin: 4.2 g/dL (ref 3.5–5.2)
BUN: 21 mg/dL (ref 6–23)
CO2: 26 meq/L (ref 19–32)
CREATININE: 1.3 mg/dL (ref 0.4–1.5)
Calcium: 9.4 mg/dL (ref 8.4–10.5)
Chloride: 103 mEq/L (ref 96–112)
GFR: 80.2 mL/min (ref >60.00)
GLUCOSE: 122 mg/dL — AB (ref 70–99)
Potassium: 3.9 mEq/L (ref 3.5–5.1)
SODIUM: 137 meq/L (ref 135–145)
Total Bilirubin: 0.7 mg/dL (ref 0.2–1.2)
Total Protein: 7.9 g/dL (ref 6.0–8.3)

## 2014-08-17 LAB — CBC WITH DIFFERENTIAL/PLATELET
BASOS PCT: 0.9 % (ref 0.0–3.0)
Basophils Absolute: 0 10*3/uL (ref 0.0–0.1)
EOS PCT: 4.6 % (ref 0.0–5.0)
Eosinophils Absolute: 0.2 10*3/uL (ref 0.0–0.7)
HEMATOCRIT: 46.8 % (ref 39.0–52.0)
HEMOGLOBIN: 15.6 g/dL (ref 13.0–17.0)
LYMPHS ABS: 2.9 10*3/uL (ref 0.7–4.0)
Lymphocytes Relative: 60 % — ABNORMAL HIGH (ref 12.0–46.0)
MCHC: 33.5 g/dL (ref 30.0–36.0)
MCV: 88.5 fl (ref 78.0–100.0)
MONO ABS: 0.3 10*3/uL (ref 0.1–1.0)
MONOS PCT: 6.7 % (ref 3.0–12.0)
NEUTROS ABS: 1.4 10*3/uL (ref 1.4–7.7)
Neutrophils Relative %: 27.8 % — ABNORMAL LOW (ref 43.0–77.0)
Platelets: 199 10*3/uL (ref 150.0–400.0)
RBC: 5.28 Mil/uL (ref 4.22–5.81)
RDW: 13.2 % (ref 11.5–15.5)
WBC: 4.9 10*3/uL (ref 4.0–10.5)

## 2014-08-17 LAB — LIPID PANEL
CHOLESTEROL: 146 mg/dL (ref 0–200)
HDL: 33.3 mg/dL — AB (ref >39.00)
LDL Cholesterol: 99 mg/dL (ref 0–99)
NonHDL: 112.7
Total CHOL/HDL Ratio: 4
Triglycerides: 67 mg/dL (ref 0.0–149.0)
VLDL: 13.4 mg/dL (ref 0.0–40.0)

## 2014-08-17 LAB — HEMOGLOBIN A1C: Hgb A1c MFr Bld: 6.5 % (ref 4.6–6.5)

## 2014-08-17 LAB — TSH: TSH: 1.26 u[IU]/mL (ref 0.35–4.50)

## 2014-08-17 MED ORDER — RANITIDINE HCL 300 MG PO TABS: 300.0000 mg | ORAL_TABLET | Freq: Every day | ORAL | Status: DC

## 2014-08-17 NOTE — Patient Instructions (Signed)
Health Maintenance A healthy lifestyle and preventative care can promote health and wellness.  Maintain regular health, dental, and eye exams.  Eat a healthy diet. Foods like vegetables, fruits, whole grains, low-fat dairy products, and lean protein foods contain the nutrients you need and are low in calories. Decrease your intake of foods high in solid fats, added sugars, and salt. Get information about a proper diet from your health care provider, if necessary.  Regular physical exercise is one of the most important things you can do for your health. Most adults should get at least 150 minutes of moderate-intensity exercise (any activity that increases your heart rate and causes you to sweat) each week. In addition, most adults need muscle-strengthening exercises on 2 or more days a week.   Maintain a healthy weight. The body mass index (BMI) is a screening tool to identify possible weight problems. It provides an estimate of body fat based on height and weight. Your health care provider can find your BMI and can help you achieve or maintain a healthy weight. For males 20 years and older:  A BMI below 18.5 is considered underweight.  A BMI of 18.5 to 24.9 is normal.  A BMI of 25 to 29.9 is considered overweight.  A BMI of 30 and above is considered obese.  Maintain normal blood lipids and cholesterol by exercising and minimizing your intake of saturated fat. Eat a balanced diet with plenty of fruits and vegetables. Blood tests for lipids and cholesterol should begin at age 20 and be repeated every 5 years. If your lipid or cholesterol levels are high, you are over age 50, or you are at high risk for heart disease, you may need your cholesterol levels checked more frequently.Ongoing high lipid and cholesterol levels should be treated with medicines if diet and exercise are not working.  If you smoke, find out from your health care provider how to quit. If you do not use tobacco, do not  start.  Lung cancer screening is recommended for adults aged 55-80 years who are at high risk for developing lung cancer because of a history of smoking. A yearly low-dose CT scan of the lungs is recommended for people who have at least a 30-pack-year history of smoking and are current smokers or have quit within the past 15 years. A pack year of smoking is smoking an average of 1 pack of cigarettes a day for 1 year (for example, a 30-pack-year history of smoking could mean smoking 1 pack a day for 30 years or 2 packs a day for 15 years). Yearly screening should continue until the smoker has stopped smoking for at least 15 years. Yearly screening should be stopped for people who develop a health problem that would prevent them from having lung cancer treatment.  If you choose to drink alcohol, do not have more than 2 drinks per day. One drink is considered to be 12 oz (360 mL) of beer, 5 oz (150 mL) of wine, or 1.5 oz (45 mL) of liquor.  Avoid the use of street drugs. Do not share needles with anyone. Ask for help if you need support or instructions about stopping the use of drugs.  High blood pressure causes heart disease and increases the risk of stroke. Blood pressure should be checked at least every 1-2 years. Ongoing high blood pressure should be treated with medicines if weight loss and exercise are not effective.  If you are 45-79 years old, ask your health care provider if   you should take aspirin to prevent heart disease.  Diabetes screening involves taking a blood sample to check your fasting blood sugar level. This should be done once every 3 years after age 45 if you are at a normal weight and without risk factors for diabetes. Testing should be considered at a younger age or be carried out more frequently if you are overweight and have at least 1 risk factor for diabetes.  Colorectal cancer can be detected and often prevented. Most routine colorectal cancer screening begins at the age of 50  and continues through age 75. However, your health care provider may recommend screening at an earlier age if you have risk factors for colon cancer. On a yearly basis, your health care provider may provide home test kits to check for hidden blood in the stool. A small camera at the end of a tube may be used to directly examine the colon (sigmoidoscopy or colonoscopy) to detect the earliest forms of colorectal cancer. Talk to your health care provider about this at age 50 when routine screening begins. A direct exam of the colon should be repeated every 5-10 years through age 75, unless early forms of precancerous polyps or small growths are found.  People who are at an increased risk for hepatitis B should be screened for this virus. You are considered at high risk for hepatitis B if:  You were born in a country where hepatitis B occurs often. Talk with your health care provider about which countries are considered high risk.  Your parents were born in a high-risk country and you have not received a shot to protect against hepatitis B (hepatitis B vaccine).  You have HIV or AIDS.  You use needles to inject street drugs.  You live with, or have sex with, someone who has hepatitis B.  You are a man who has sex with other men (MSM).  You get hemodialysis treatment.  You take certain medicines for conditions like cancer, organ transplantation, and autoimmune conditions.  Hepatitis C blood testing is recommended for all people born from 1945 through 1965 and any individual with known risk factors for hepatitis C.  Healthy men should no longer receive prostate-specific antigen (PSA) blood tests as part of routine cancer screening. Talk to your health care provider about prostate cancer screening.  Testicular cancer screening is not recommended for adolescents or adult males who have no symptoms. Screening includes self-exam, a health care provider exam, and other screening tests. Consult with your  health care provider about any symptoms you have or any concerns you have about testicular cancer.  Practice safe sex. Use condoms and avoid high-risk sexual practices to reduce the spread of sexually transmitted infections (STIs).  You should be screened for STIs, including gonorrhea and chlamydia if:  You are sexually active and are younger than 24 years.  You are older than 24 years, and your health care provider tells you that you are at risk for this type of infection.  Your sexual activity has changed since you were last screened, and you are at an increased risk for chlamydia or gonorrhea. Ask your health care provider if you are at risk.  If you are at risk of being infected with HIV, it is recommended that you take a prescription medicine daily to prevent HIV infection. This is called pre-exposure prophylaxis (PrEP). You are considered at risk if:  You are a man who has sex with other men (MSM).  You are a heterosexual man who   is sexually active with multiple partners.  You take drugs by injection.  You are sexually active with a partner who has HIV.  Talk with your health care provider about whether you are at high risk of being infected with HIV. If you choose to begin PrEP, you should first be tested for HIV. You should then be tested every 3 months for as long as you are taking PrEP.  Use sunscreen. Apply sunscreen liberally and repeatedly throughout the day. You should seek shade when your shadow is shorter than you. Protect yourself by wearing long sleeves, pants, a wide-brimmed hat, and sunglasses year round whenever you are outdoors.  Tell your health care provider of new moles or changes in moles, especially if there is a change in shape or color. Also, tell your health care provider if a mole is larger than the size of a pencil eraser.  A one-time screening for abdominal aortic aneurysm (AAA) and surgical repair of large AAAs by ultrasound is recommended for men aged  65-75 years who are current or former smokers.  Stay current with your vaccines (immunizations). Document Released: 06/01/2008 Document Revised: 12/09/2013 Document Reviewed: 05/01/2011 ExitCare Patient Information 2015 [employer]. This information is not intended to replace advice given to you by your health care provider. Make sure you discuss any questions you have with your health care provider. Gastroesophageal Reflux Disease, Adult Gastroesophageal reflux disease (GERD) happens when acid from your stomach flows up into the esophagus. When acid comes in contact with the esophagus, the acid causes soreness (inflammation) in the esophagus. Over time, GERD may create small holes (ulcers) in the lining of the esophagus. CAUSES   Increased body weight. This puts pressure on the stomach, making acid rise from the stomach into the esophagus.  Smoking. This increases acid production in the stomach.  Drinking alcohol. This causes decreased pressure in the lower esophageal sphincter (valve or ring of muscle between the esophagus and stomach), allowing acid from the stomach into the esophagus.  Late evening meals and a full stomach. This increases pressure and acid production in the stomach.  A malformed lower esophageal sphincter. Sometimes, no cause is found. SYMPTOMS   Burning pain in the lower part of the mid-chest behind the breastbone and in the mid-stomach area. This may occur twice a week or more often.  Trouble swallowing.  Sore throat.  Dry cough.  Asthma-like symptoms including chest tightness, shortness of breath, or wheezing. DIAGNOSIS  Your caregiver may be able to diagnose GERD based on your symptoms. In some cases, X-rays and other tests may be done to check for complications or to check the condition of your stomach and esophagus. TREATMENT  Your caregiver may recommend over-the-counter or prescription medicines to help decrease acid production. Ask your caregiver  before starting or adding any new medicines.  HOME CARE INSTRUCTIONS   Change the factors that you can control. Ask your caregiver for guidance concerning weight loss, quitting smoking, and alcohol consumption.  Avoid foods and drinks that make your symptoms worse, such as:  Caffeine or alcoholic drinks.  Chocolate.  Peppermint or mint flavorings.  Garlic and onions.  Spicy foods.  Citrus fruits, such as oranges, lemons, or limes.  Tomato-based foods such as sauce, chili, salsa, and pizza.  Fried and fatty foods.  Avoid lying down for the 3 hours prior to your bedtime or prior to taking a nap.  Eat small, frequent meals instead of large meals.  Wear loose-fitting clothing. Do not wear anything   tight around your waist that causes pressure on your stomach.  Raise the head of your bed 6 to 8 inches with wood blocks to help you sleep. Extra pillows will not help.  Only take over-the-counter or prescription medicines for pain, discomfort, or fever as directed by your caregiver.  Do not take aspirin, ibuprofen, or other nonsteroidal anti-inflammatory drugs (NSAIDs). SEEK IMMEDIATE MEDICAL CARE IF:   You have pain in your arms, neck, jaw, teeth, or back.  Your pain increases or changes in intensity or duration.  You develop nausea, vomiting, or sweating (diaphoresis).  You develop shortness of breath, or you faint.  Your vomit is green, yellow, black, or looks like coffee grounds or blood.  Your stool is red, bloody, or black. These symptoms could be signs of other problems, such as heart disease, gastric bleeding, or esophageal bleeding. MAKE SURE YOU:   Understand these instructions.  Will watch your condition.  Will get help right away if you are not doing well or get worse. Document Released: 09/13/2005 Document Revised: 02/26/2012 Document Reviewed: 06/23/2011 ExitCare Patient Information 2015 [employer]. This information is not intended to replace advice  given to you by your health care provider. Make sure you discuss any questions you have with your health care provider.  

## 2014-08-17 NOTE — Progress Notes (Signed)
Subjective:    Patient ID: Robert [name], male    DOB: 07/10/1981, 33 y.o.   MRN: [id number]  Gastrophageal Reflux He complains of heartburn. He reports no abdominal pain, no belching, no chest pain, no choking, no coughing, no dysphagia, no early satiety, no globus sensation, no hoarse voice, no nausea, no sore throat, no stridor, no tooth decay, no water brash or no wheezing. This is a recurrent problem. The current episode started more than 1 month ago. The problem occurs occasionally. The problem has been unchanged. The heartburn duration is several minutes. The heartburn is located in the substernum. The heartburn is of mild intensity. The heartburn does not wake him from sleep. The heartburn does not limit his activity. The heartburn doesn't change with position. Nothing aggravates the symptoms. Pertinent negatives include no anemia, fatigue, melena, muscle weakness, orthopnea or weight loss. There are no known risk factors. He has tried a histamine-2 antagonist for the symptoms. The treatment provided mild relief.      Review of Systems  Constitutional: Negative.  Negative for fever, chills, weight loss, diaphoresis, appetite change and fatigue.  HENT: Negative.  Negative for hoarse voice and sore throat.   Eyes: Negative.   Respiratory: Negative.  Negative for cough, choking and wheezing.   Cardiovascular: Negative.  Negative for chest pain.  Gastrointestinal: Positive for heartburn. Negative for dysphagia, nausea, abdominal pain and melena.  Endocrine: Negative.  Negative for polydipsia, polyphagia and polyuria.  Genitourinary: Positive for penile swelling and penile pain. Negative for dysuria, urgency, frequency, hematuria, flank pain, decreased urine volume, discharge, scrotal swelling, enuresis, difficulty urinating, genital sores and testicular pain.       He was at work a week ago and had an injury to the left side of his penis, now there is pain/swelling/and an abnormal hard place  on the left penile shaft.  Musculoskeletal: Negative.  Negative for muscle weakness.  Skin: Negative.  Negative for color change, pallor, rash and wound.  Allergic/Immunologic: Negative.   Neurological: Negative.  Negative for dizziness, tremors, speech difficulty, weakness, light-headedness and numbness.  Hematological: Negative.  Negative for adenopathy. Does not bruise/bleed easily.  Psychiatric/Behavioral: Negative.        Objective:   Physical Exam  Vitals reviewed. Constitutional: He is oriented to person, place, and time. He appears well-developed and well-nourished.  Non-toxic appearance. He does not have a sickly appearance. He does not appear ill. No distress.  HENT:  Head: Normocephalic and atraumatic.  Mouth/Throat: Oropharynx is clear and moist. No oropharyngeal exudate.  Eyes: Conjunctivae are normal. Right eye exhibits no discharge. Left eye exhibits no discharge. No scleral icterus.  Neck: Normal range of motion. Neck supple. No JVD present. No tracheal deviation present. No thyromegaly present.  Cardiovascular: Normal rate, regular rhythm, normal heart sounds and intact distal pulses.  Exam reveals no gallop and no friction rub.   No murmur heard. Pulmonary/Chest: Effort normal and breath sounds normal. No stridor. No respiratory distress. He has no wheezes. He has no rales. He exhibits no tenderness.  Abdominal: Soft. Bowel sounds are normal. He exhibits no distension and no mass. There is no tenderness. There is no rebound and no guarding. Hernia confirmed negative in the right inguinal area and confirmed negative in the left inguinal area.  Genitourinary: Testes normal and penis normal.    Right testis shows no mass, no swelling and no tenderness. Right testis is descended. Left testis shows no mass, no swelling and no tenderness. Left testis is  descended. Circumcised. No penile erythema or penile tenderness. No discharge found.  Musculoskeletal: Normal range of motion.  He exhibits no edema and no tenderness.  Lymphadenopathy:    He has no cervical adenopathy.       Right: No inguinal adenopathy present.       Left: No inguinal adenopathy present.  Neurological: He is oriented to person, place, and time.  Skin: Skin is warm and dry. No rash noted. He is not diaphoretic. No erythema. No pallor.  Psychiatric: He has a normal mood and affect. His behavior is normal. Judgment and thought content normal.     Lab Results  Component Value Date   WBC 5.2 01/02/2014   HGB 14.8 01/02/2014   HCT 43.7 01/02/2014   PLT 174.0 01/02/2014   GLUCOSE 125* 01/02/2014   ALT 26 01/02/2014   AST 22 01/02/2014   NA 138 01/02/2014   K 3.8 01/02/2014   CL 105 01/02/2014   CREATININE 1.3 01/02/2014   BUN 16 01/02/2014   CO2 27 01/02/2014   TSH 1.58 01/02/2014       Assessment & Plan:

## 2014-08-18 NOTE — Assessment & Plan Note (Signed)
Urology referral

## 2014-08-18 NOTE — Assessment & Plan Note (Signed)
Exam done Vaccines were addresses Labs ordered Pt ed material was given

## 2014-08-18 NOTE — Assessment & Plan Note (Signed)
This is a new diagnosis, no meds needed at this time

## 2014-08-18 NOTE — Assessment & Plan Note (Signed)
Will treat with zantac 

## 2015-05-06 ENCOUNTER — Other Ambulatory Visit: Payer: Self-pay

## 2015-05-06 DIAGNOSIS — K219 Gastro-esophageal reflux disease without esophagitis: Secondary | ICD-10-CM

## 2015-05-06 MED ORDER — RANITIDINE HCL 300 MG PO TABS: 300.0000 mg | ORAL_TABLET | Freq: Every day | ORAL | Status: DC

## 2015-05-28 ENCOUNTER — Ambulatory Visit: Payer: Managed Care, Other (non HMO) | Admitting: Internal Medicine

## 2015-05-28 ENCOUNTER — Encounter: Payer: Self-pay | Admitting: Family

## 2015-05-28 ENCOUNTER — Ambulatory Visit (INDEPENDENT_AMBULATORY_CARE_PROVIDER_SITE_OTHER): Payer: Managed Care, Other (non HMO) | Admitting: Family

## 2015-05-28 VITALS — BP 116/70 | HR 81 | Temp 98.6°F | Resp 18 | Ht 72.0 in | Wt 236.0 lb

## 2015-05-28 DIAGNOSIS — H6091 Unspecified otitis externa, right ear: Secondary | ICD-10-CM | POA: Diagnosis not present

## 2015-05-28 DIAGNOSIS — H6090 Unspecified otitis externa, unspecified ear: Secondary | ICD-10-CM | POA: Insufficient documentation

## 2015-05-28 MED ORDER — NEOMYCIN-POLYMYXIN-HC 3.5-10000-1 OT SOLN: 4.0000 [drp] | Freq: Three times a day (TID) | OTIC | Status: DC

## 2015-05-28 NOTE — Progress Notes (Signed)
   Subjective:    Patient ID: Robert [name], male    DOB: 1981-03-06, 34 y.o.   MRN: [id number]  Chief Complaint  Patient presents with  . Ear Pain    x3 days, right ear pain, dizziness, headache, and can't eat bc it hurts so back     HPI:  Robert [name] is a 34 y.o. male with a PMH of sleep apnea, GERD, and Type 2 diabetes who presents today for an acute office visit.  Associated symptoms of pain, dizziness, and headache have been going on for approximately 3 days. Indicates pain increases when he tries to eat. Modifying factors include bella donna drops which have not helped at all.  Pain is described as sharp and achy with a severity of 10/10.    No Known Allergies   Current Outpatient Prescriptions on File Prior to Visit  Medication Sig Dispense Refill  . ranitidine (ZANTAC) 300 MG tablet Take 1 tablet (300 mg total) by mouth at bedtime. 90 tablet 1   No current facility-administered medications on file prior to visit.    Review of Systems  Constitutional: Negative for fever and chills.  HENT: Positive for ear pain.   Neurological: Positive for headaches.      Objective:    BP 116/70 mmHg  Pulse 81  Temp(Src) 98.6 F (37 C) (Oral)  Resp 18  Ht 6' (1.829 m)  Wt 236 lb (107.049 kg)  BMI 32.00 kg/m2  SpO2 97% Nursing note and vital signs reviewed.  Physical Exam  Constitutional: He is oriented to person, place, and time. He appears well-developed and well-nourished. No distress.  HENT:  Right Ear: Hearing and tympanic membrane normal. There is swelling and tenderness.  Left Ear: Hearing, tympanic membrane, external ear and ear canal normal.  Cardiovascular: Normal rate, regular rhythm, normal heart sounds and intact distal pulses.   Pulmonary/Chest: Effort normal and breath sounds normal.  Neurological: He is alert and oriented to person, place, and time.  Skin: Skin is warm and dry.  Psychiatric: He has a normal mood and affect. His behavior is normal. Judgment  and thought content normal.       Assessment & Plan:   Problem List Items Addressed This Visit      Nervous and Auditory   Otitis externa - Primary    Symptoms and exam consistent with otitis externa. Start Cortisporin. Tylenol as needed for headaches. Follow-up if symptoms worsen or fail to improve.      Relevant Medications   neomycin-polymyxin-hydrocortisone (CORTISPORIN) otic solution

## 2015-05-28 NOTE — Assessment & Plan Note (Signed)
Symptoms and exam consistent with otitis externa. Start Cortisporin. Tylenol as needed for headaches. Follow-up if symptoms worsen or fail to improve.

## 2015-05-28 NOTE — Patient Instructions (Signed)
Thank you for choosing [facility].  Summary/Instructions:  Your prescription(s) have been submitted to your pharmacy or been printed and provided for you. Please take as directed and contact our office if you believe you are having problem(s) with the medication(s) or have any questions.  If your symptoms worsen or fail to improve, please contact our office for further instruction, or in case of emergency go directly to the emergency room at the closest medical facility.   Otitis Externa Otitis externa is a bacterial or fungal infection of the outer ear canal. This is the area from the eardrum to the outside of the ear. Otitis externa is sometimes called "swimmer's ear." CAUSES  Possible causes of infection include:  Swimming in dirty water.  Moisture remaining in the ear after swimming or bathing.  Mild injury (trauma) to the ear.  Objects stuck in the ear (foreign body).  Cuts or scrapes (abrasions) on the outside of the ear. SIGNS AND SYMPTOMS  The first symptom of infection is often itching in the ear canal. Later signs and symptoms may include swelling and redness of the ear canal, ear pain, and yellowish-white fluid (pus) coming from the ear. The ear pain may be worse when pulling on the earlobe. DIAGNOSIS  Your health care provider will perform a physical exam. A sample of fluid may be taken from the ear and examined for bacteria or fungi. TREATMENT  Antibiotic ear drops are often given for 10 to 14 days. Treatment may also include pain medicine or corticosteroids to reduce itching and swelling. HOME CARE INSTRUCTIONS   Apply antibiotic ear drops to the ear canal as prescribed by your health care provider.  Take medicines only as directed by your health care provider.  If you have diabetes, follow any additional treatment instructions from your health care provider.  Keep all follow-up visits as directed by your health care provider. PREVENTION   Keep your ear  dry. Use the corner of a towel to absorb water out of the ear canal after swimming or bathing.  Avoid scratching or putting objects inside your ear. This can damage the ear canal or remove the protective wax that lines the canal. This makes it easier for bacteria and fungi to grow.  Avoid swimming in lakes, polluted water, or poorly chlorinated pools.  You may use ear drops made of rubbing alcohol and vinegar after swimming. Combine equal parts of white vinegar and alcohol in a bottle. Put 3 or 4 drops into each ear after swimming. SEEK MEDICAL CARE IF:   You have a fever.  Your ear is still red, swollen, painful, or draining pus after 3 days.  Your redness, swelling, or pain gets worse.  You have a severe headache.  You have redness, swelling, pain, or tenderness in the area behind your ear. MAKE SURE YOU:   Understand these instructions.  Will watch your condition.  Will get help right away if you are not doing well or get worse. Document Released: 12/04/2005 Document Revised: 04/20/2014 Document Reviewed: 12/21/2011 Exit[employer] Patient Information 2015 [employer]. This information is not intended to replace advice given to you by your health care provider. Make sure you discuss any questions you have with your health care provider.   

## 2015-05-28 NOTE — Progress Notes (Signed)
Pre visit review using our clinic review tool, if applicable. No additional management support is needed unless otherwise documented below in the visit note. 

## 2015-10-05 ENCOUNTER — Other Ambulatory Visit (INDEPENDENT_AMBULATORY_CARE_PROVIDER_SITE_OTHER): Payer: Managed Care, Other (non HMO)

## 2015-10-05 ENCOUNTER — Ambulatory Visit (INDEPENDENT_AMBULATORY_CARE_PROVIDER_SITE_OTHER): Payer: Managed Care, Other (non HMO) | Admitting: Internal Medicine

## 2015-10-05 ENCOUNTER — Encounter: Payer: Self-pay | Admitting: Internal Medicine

## 2015-10-05 VITALS — BP 118/78 | HR 79 | Temp 98.3°F | Resp 14 | Ht 72.0 in | Wt 233.0 lb

## 2015-10-05 DIAGNOSIS — E118 Type 2 diabetes mellitus with unspecified complications: Secondary | ICD-10-CM | POA: Diagnosis not present

## 2015-10-05 DIAGNOSIS — Z0000 Encounter for general adult medical examination without abnormal findings: Secondary | ICD-10-CM

## 2015-10-05 LAB — CBC WITH DIFFERENTIAL/PLATELET
BASOS PCT: 0.8 % (ref 0.0–3.0)
Basophils Absolute: 0.1 10*3/uL (ref 0.0–0.1)
EOS ABS: 0.2 10*3/uL (ref 0.0–0.7)
EOS PCT: 3.4 % (ref 0.0–5.0)
HCT: 44.4 % (ref 39.0–52.0)
HEMOGLOBIN: 14.9 g/dL (ref 13.0–17.0)
LYMPHS ABS: 3.1 10*3/uL (ref 0.7–4.0)
LYMPHS PCT: 51 % — AB (ref 12.0–46.0)
MCHC: 33.6 g/dL (ref 30.0–36.0)
MCV: 87.1 fl (ref 78.0–100.0)
MONO ABS: 0.5 10*3/uL (ref 0.1–1.0)
MONOS PCT: 7.9 % (ref 3.0–12.0)
Neutro Abs: 2.3 10*3/uL (ref 1.4–7.7)
Neutrophils Relative %: 36.9 % — ABNORMAL LOW (ref 43.0–77.0)
Platelets: 188 10*3/uL (ref 150.0–400.0)
RBC: 5.09 Mil/uL (ref 4.22–5.81)
RDW: 13.1 % (ref 11.5–15.5)
WBC: 6.1 10*3/uL (ref 4.0–10.5)

## 2015-10-05 LAB — URINALYSIS, ROUTINE W REFLEX MICROSCOPIC
Bilirubin Urine: NEGATIVE
HGB URINE DIPSTICK: NEGATIVE
Ketones, ur: NEGATIVE
LEUKOCYTES UA: NEGATIVE
NITRITE: NEGATIVE
RBC / HPF: NONE SEEN (ref 0–?)
SPECIFIC GRAVITY, URINE: 1.02 (ref 1.000–1.030)
Total Protein, Urine: NEGATIVE
URINE GLUCOSE: NEGATIVE
Urobilinogen, UA: 1 (ref 0.0–1.0)
WBC UA: NONE SEEN (ref 0–?)
pH: 7 (ref 5.0–8.0)

## 2015-10-05 LAB — LIPID PANEL
CHOLESTEROL: 133 mg/dL (ref 0–200)
HDL: 31.8 mg/dL — AB (ref >39.00)
LDL Cholesterol: 86 mg/dL (ref 0–99)
NonHDL: 100.88
TRIGLYCERIDES: 72 mg/dL (ref 0.0–149.0)
Total CHOL/HDL Ratio: 4
VLDL: 14.4 mg/dL (ref 0.0–40.0)

## 2015-10-05 LAB — MICROALBUMIN / CREATININE URINE RATIO
Creatinine,U: 228.9 mg/dL
MICROALB/CREAT RATIO: 0.3 mg/g (ref 0.0–30.0)
Microalb, Ur: <0.7 mg/dL (ref 0.0–1.9)

## 2015-10-05 LAB — COMPREHENSIVE METABOLIC PANEL
ALT: 20 U/L (ref 0–53)
AST: 16 U/L (ref 0–37)
Albumin: 4.3 g/dL (ref 3.5–5.2)
Alkaline Phosphatase: 51 U/L (ref 39–117)
BILIRUBIN TOTAL: 0.4 mg/dL (ref 0.2–1.2)
BUN: 19 mg/dL (ref 6–23)
CALCIUM: 9.3 mg/dL (ref 8.4–10.5)
CHLORIDE: 105 meq/L (ref 96–112)
CO2: 29 meq/L (ref 19–32)
CREATININE: 1.35 mg/dL (ref 0.40–1.50)
GFR: 76.96 mL/min (ref >60.00)
GLUCOSE: 118 mg/dL — AB (ref 70–99)
Potassium: 3.9 mEq/L (ref 3.5–5.1)
SODIUM: 138 meq/L (ref 135–145)
Total Protein: 7.4 g/dL (ref 6.0–8.3)

## 2015-10-05 LAB — TSH: TSH: 1.28 u[IU]/mL (ref 0.35–4.50)

## 2015-10-05 LAB — HEMOGLOBIN A1C: Hgb A1c MFr Bld: 6.4 % (ref 4.6–6.5)

## 2015-10-05 NOTE — Progress Notes (Signed)
Pre visit review using our clinic review tool, if applicable. No additional management support is needed unless otherwise documented below in the visit note. 

## 2015-10-05 NOTE — Patient Instructions (Signed)

## 2015-10-05 NOTE — Progress Notes (Signed)
Subjective:  Patient ID: Robert [name], male    DOB: 01/06/1981  Age: 34 y.o. MRN: [id number]  CC: Hypertension; Diabetes; and Annual Exam   HPI Robert [name] presents for a CPX as well as f/up on DM2- he feels well and offers no complaints.  Outpatient Prescriptions Prior to Visit  Medication Sig Dispense Refill  . neomycin-polymyxin-hydrocortisone (CORTISPORIN) otic solution Place 4 drops into the right ear 3 (three) times daily. (Patient not taking: Reported on 10/05/2015) 10 mL 0  . ranitidine (ZANTAC) 300 MG tablet Take 1 tablet (300 mg total) by mouth at bedtime. (Patient not taking: Reported on 10/05/2015) 90 tablet 1   No facility-administered medications prior to visit.    ROS Review of Systems  Constitutional: Negative.  Negative for fever, chills, diaphoresis, appetite change and fatigue.  HENT: Negative.  Negative for congestion, facial swelling, sinus pressure, tinnitus and trouble swallowing.   Eyes: Negative.   Respiratory: Negative.  Negative for cough, choking, chest tightness, shortness of breath and stridor.   Cardiovascular: Negative.  Negative for chest pain, palpitations and leg swelling.  Gastrointestinal: Negative.  Negative for nausea, vomiting, abdominal pain, diarrhea, constipation and blood in stool.  Endocrine: Negative.  Negative for polydipsia, polyphagia and polyuria.  Genitourinary: Negative.  Negative for dysuria, urgency, frequency, hematuria, decreased urine volume and difficulty urinating.  Musculoskeletal: Negative.  Negative for myalgias, back pain, joint swelling, arthralgias and neck stiffness.  Skin: Negative.  Negative for rash.  Allergic/Immunologic: Negative.   Neurological: Negative.   Hematological: Negative.  Negative for adenopathy. Does not bruise/bleed easily.  Psychiatric/Behavioral: Negative.     Objective:  BP 118/78 mmHg  Pulse 79  Temp(Src) 98.3 F (36.8 C) (Oral)  Resp 14  Ht 6' (1.829 m)  Wt 233 lb (105.688 kg)   BMI 31.59 kg/m2  SpO2 97%  BP Readings from Last 3 Encounters:  10/05/15 118/78  05/28/15 116/70  08/17/14 118/70    Wt Readings from Last 3 Encounters:  10/05/15 233 lb (105.688 kg)  05/28/15 236 lb (107.049 kg)  08/17/14 228 lb (103.42 kg)    Physical Exam  Constitutional: He is oriented to person, place, and time. He appears well-developed and well-nourished. No distress.  HENT:  Head: Normocephalic and atraumatic.  Mouth/Throat: Oropharynx is clear and moist. No oropharyngeal exudate.  Eyes: Conjunctivae are normal. Right eye exhibits no discharge. Left eye exhibits no discharge. No scleral icterus.  Neck: Normal range of motion. Neck supple. No JVD present. No tracheal deviation present. No thyromegaly present.  Cardiovascular: Normal rate, regular rhythm, normal heart sounds and intact distal pulses.  Exam reveals no gallop and no friction rub.   No murmur heard. Pulmonary/Chest: Effort normal and breath sounds normal. No stridor. No respiratory distress. He has no wheezes. He has no rales. He exhibits no tenderness.  Abdominal: Soft. Bowel sounds are normal. He exhibits no distension and no mass. There is no tenderness. There is no rebound and no guarding. Hernia confirmed negative in the right inguinal area and confirmed negative in the left inguinal area.  Genitourinary: Testes normal and penis normal. Right testis shows no mass, no swelling and no tenderness. Right testis is descended. Left testis shows no mass, no swelling and no tenderness. Left testis is descended. Circumcised. No penile erythema or penile tenderness. No discharge found.  Musculoskeletal: Normal range of motion. He exhibits no edema or tenderness.  Lymphadenopathy:    He has no cervical adenopathy.       Right:  No inguinal adenopathy present.       Left: No inguinal adenopathy present.  Neurological: He is oriented to person, place, and time.  Skin: Skin is warm and dry. No rash noted. He is not  diaphoretic. No erythema. No pallor.  Vitals reviewed.   Lab Results  Component Value Date   WBC 6.1 10/05/2015   HGB 14.9 10/05/2015   HCT 44.4 10/05/2015   PLT 188.0 10/05/2015   GLUCOSE 118* 10/05/2015   CHOL 133 10/05/2015   TRIG 72.0 10/05/2015   HDL 31.80* 10/05/2015   LDLCALC 86 10/05/2015   ALT 20 10/05/2015   AST 16 10/05/2015   NA 138 10/05/2015   K 3.9 10/05/2015   CL 105 10/05/2015   CREATININE 1.35 10/05/2015   BUN 19 10/05/2015   CO2 29 10/05/2015   TSH 1.28 10/05/2015   HGBA1C 6.4 10/05/2015   MICROALBUR <0.7 10/05/2015    No results found.  Assessment & Plan:   [name] was seen today for hypertension, diabetes and annual exam.  Diagnoses and all orders for this visit:  Type 2 diabetes mellitus with complication, without long-term current use of insulin (HCC)- his A1C is down to 6.4% so technically he has prediabetes and does not need any medications, he will cont to work on his lifestyle modifications -     Microalbumin / creatinine urine ratio; Future -     Hemoglobin A1c; Future -     Ambulatory referral to Ophthalmology  Routine general medical examination at a health care facility- exam done, vaccines were reviewed and updated, labs ordered, pt ed material was given -     Lipid panel; Future -     CBC with Differential/Platelet; Future -     Comprehensive metabolic panel; Future -     TSH; Future -     Urinalysis, Routine w reflex microscopic (not at [facility]); Future   I have discontinued [name] ranitidine and neomycin-polymyxin-hydrocortisone.  No orders of the defined types were placed in this encounter.     Follow-up: Return in about 6 months (around 04/04/2016).  Sanda Linger, MD

## 2015-10-06 ENCOUNTER — Encounter: Payer: Self-pay | Admitting: Internal Medicine

## 2016-02-29 ENCOUNTER — Telehealth: Payer: Self-pay

## 2016-02-29 NOTE — Telephone Encounter (Signed)
LVM for pt to call back as soon as possible.   RE: Flu vaccine 2016-2017 

## 2016-03-01 ENCOUNTER — Ambulatory Visit: Payer: Managed Care, Other (non HMO)

## 2017-06-07 LAB — HM DIABETES EYE EXAM

## 2017-06-27 ENCOUNTER — Telehealth: Payer: Self-pay

## 2017-06-27 NOTE — Telephone Encounter (Signed)
Pt is due for an appt. Can you call and see if he will schedule.

## 2017-06-28 NOTE — Telephone Encounter (Signed)
Got scheduled  °

## 2017-07-11 ENCOUNTER — Encounter: Payer: Managed Care, Other (non HMO) | Admitting: Internal Medicine

## 2017-12-12 ENCOUNTER — Other Ambulatory Visit (INDEPENDENT_AMBULATORY_CARE_PROVIDER_SITE_OTHER): Payer: Managed Care, Other (non HMO)

## 2017-12-12 ENCOUNTER — Encounter: Payer: Self-pay | Admitting: Internal Medicine

## 2017-12-12 ENCOUNTER — Ambulatory Visit (INDEPENDENT_AMBULATORY_CARE_PROVIDER_SITE_OTHER): Payer: Managed Care, Other (non HMO) | Admitting: Internal Medicine

## 2017-12-12 VITALS — BP 118/84 | HR 78 | Temp 98.7°F | Resp 16 | Ht 72.0 in | Wt 245.5 lb

## 2017-12-12 DIAGNOSIS — E118 Type 2 diabetes mellitus with unspecified complications: Secondary | ICD-10-CM

## 2017-12-12 DIAGNOSIS — Z0000 Encounter for general adult medical examination without abnormal findings: Secondary | ICD-10-CM | POA: Diagnosis not present

## 2017-12-12 DIAGNOSIS — Z23 Encounter for immunization: Secondary | ICD-10-CM

## 2017-12-12 LAB — COMPREHENSIVE METABOLIC PANEL
ALK PHOS: 55 U/L (ref 39–117)
ALT: 17 U/L (ref 0–53)
AST: 17 U/L (ref 0–37)
Albumin: 4.2 g/dL (ref 3.5–5.2)
BUN: 12 mg/dL (ref 6–23)
CHLORIDE: 103 meq/L (ref 96–112)
CO2: 29 meq/L (ref 19–32)
Calcium: 9.1 mg/dL (ref 8.4–10.5)
Creatinine, Ser: 1.29 mg/dL (ref 0.40–1.50)
GFR: 80.14 mL/min (ref >60.00)
GLUCOSE: 168 mg/dL — AB (ref 70–99)
POTASSIUM: 4.3 meq/L (ref 3.5–5.1)
SODIUM: 138 meq/L (ref 135–145)
Total Bilirubin: 0.7 mg/dL (ref 0.2–1.2)
Total Protein: 7.2 g/dL (ref 6.0–8.3)

## 2017-12-12 LAB — URINALYSIS, ROUTINE W REFLEX MICROSCOPIC
Bilirubin Urine: NEGATIVE
Hgb urine dipstick: NEGATIVE
KETONES UR: NEGATIVE
LEUKOCYTES UA: NEGATIVE
NITRITE: NEGATIVE
RBC / HPF: NONE SEEN (ref 0–?)
SPECIFIC GRAVITY, URINE: 1.025 (ref 1.000–1.030)
TOTAL PROTEIN, URINE-UPE24: NEGATIVE
URINE GLUCOSE: NEGATIVE
Urobilinogen, UA: 0.2 (ref 0.0–1.0)
WBC, UA: NONE SEEN (ref 0–?)
pH: 6.5 (ref 5.0–8.0)

## 2017-12-12 LAB — LIPID PANEL
Cholesterol: 147 mg/dL (ref 0–200)
HDL: 33.1 mg/dL — AB (ref >39.00)
LDL CALC: 101 mg/dL — AB (ref 0–99)
NONHDL: 114.38
Total CHOL/HDL Ratio: 4
Triglycerides: 68 mg/dL (ref 0.0–149.0)
VLDL: 13.6 mg/dL (ref 0.0–40.0)

## 2017-12-12 LAB — CBC WITH DIFFERENTIAL/PLATELET
Basophils Absolute: 0 10*3/uL (ref 0.0–0.1)
Basophils Relative: 0.9 % (ref 0.0–3.0)
EOS PCT: 3.2 % (ref 0.0–5.0)
Eosinophils Absolute: 0.1 10*3/uL (ref 0.0–0.7)
HCT: 46.7 % (ref 39.0–52.0)
Hemoglobin: 15.6 g/dL (ref 13.0–17.0)
LYMPHS ABS: 2.4 10*3/uL (ref 0.7–4.0)
Lymphocytes Relative: 59.3 % — ABNORMAL HIGH (ref 12.0–46.0)
MCHC: 33.5 g/dL (ref 30.0–36.0)
MCV: 88.3 fl (ref 78.0–100.0)
MONO ABS: 0.3 10*3/uL (ref 0.1–1.0)
MONOS PCT: 6.6 % (ref 3.0–12.0)
NEUTROS ABS: 1.2 10*3/uL — AB (ref 1.4–7.7)
NEUTROS PCT: 30 % — AB (ref 43.0–77.0)
PLATELETS: 187 10*3/uL (ref 150.0–400.0)
RBC: 5.29 Mil/uL (ref 4.22–5.81)
RDW: 13.1 % (ref 11.5–15.5)
WBC: 4.1 10*3/uL (ref 4.0–10.5)

## 2017-12-12 LAB — MICROALBUMIN / CREATININE URINE RATIO
CREATININE, U: 257.9 mg/dL
MICROALB/CREAT RATIO: 0.3 mg/g (ref 0.0–30.0)
Microalb, Ur: <0.7 mg/dL (ref 0.0–1.9)

## 2017-12-12 LAB — HEMOGLOBIN A1C: HEMOGLOBIN A1C: 7.6 % — AB (ref 4.6–6.5)

## 2017-12-12 MED ORDER — SITAGLIP PHOS-METFORMIN HCL ER 100-1000 MG PO TB24: 1.0000 | ORAL_TABLET | Freq: Every day | ORAL | 1 refills | Status: DC

## 2017-12-12 NOTE — Patient Instructions (Signed)

## 2017-12-12 NOTE — Progress Notes (Signed)
Subjective:  Patient ID: Robert [name] Saephanh, male    DOB: 04/08/1981  Age: 36 y.o. MRN: [id number]  CC: Annual Exam and Diabetes   HPI Robert [name] Chaddock presents for a CPX.  He thinks his blood sugars are well controlled.  He does not monitor his blood sugar. He has had no recent episodes of polyuria, polydipsia, or polyphagia.  He is not currently taking any medications for blood sugar control.  He feels well today and offers no complaints.  No outpatient medications prior to visit.   No facility-administered medications prior to visit.     ROS Review of Systems  Constitutional: Negative.  Negative for diaphoresis and fatigue.  HENT: Negative.   Eyes: Negative for visual disturbance.  Respiratory: Negative for cough, chest tightness and shortness of breath.   Cardiovascular: Negative for chest pain, palpitations and leg swelling.  Gastrointestinal: Negative for abdominal pain, constipation, diarrhea, nausea and vomiting.  Endocrine: Negative for polydipsia, polyphagia and polyuria.  Genitourinary: Negative.  Negative for difficulty urinating, genital sores, hematuria, penile swelling, scrotal swelling, testicular pain and urgency.  Musculoskeletal: Negative.  Negative for back pain, myalgias and neck pain.  Skin: Negative.  Negative for color change and rash.  Allergic/Immunologic: Negative.   Neurological: Negative.  Negative for dizziness, weakness and headaches.  Hematological: Negative for adenopathy. Does not bruise/bleed easily.  Psychiatric/Behavioral: Negative.     Objective:  BP 118/84 (BP Location: Left Arm, Patient Position: Sitting, Cuff Size: Large)   Pulse 78   Temp 98.7 F (37.1 C) (Oral)   Resp 16   Ht 6' (1.829 m)   Wt 245 lb 8 oz (111.4 kg)   SpO2 98%   BMI 33.30 kg/m   BP Readings from Last 3 Encounters:  12/12/17 118/84  10/05/15 118/78  05/28/15 116/70    Wt Readings from Last 3 Encounters:  12/12/17 245 lb 8 oz (111.4 kg)  10/05/15 233 lb (105.7  kg)  05/28/15 236 lb (107 kg)    Physical Exam  Constitutional: He is oriented to person, place, and time. No distress.  HENT:  Mouth/Throat: Oropharynx is clear and moist. No oropharyngeal exudate.  Eyes: Conjunctivae are normal. Left eye exhibits no discharge. No scleral icterus.  Neck: Normal range of motion. Neck supple. No JVD present. No thyromegaly present.  Cardiovascular: Normal rate, regular rhythm and normal heart sounds.  No murmur heard. Pulmonary/Chest: Effort normal and breath sounds normal. No respiratory distress. He has no wheezes. He has no rales.  Abdominal: Soft. Bowel sounds are normal. He exhibits no mass. There is no tenderness. There is no guarding.  Musculoskeletal: Normal range of motion. He exhibits no edema, tenderness or deformity.  Lymphadenopathy:    He has no cervical adenopathy.  Neurological: He is alert and oriented to person, place, and time.  Skin: Skin is warm and dry. No rash noted. He is not diaphoretic. No erythema. No pallor.  Vitals reviewed.   Lab Results  Component Value Date   WBC 4.1 12/12/2017   HGB 15.6 12/12/2017   HCT 46.7 12/12/2017   PLT 187.0 12/12/2017   GLUCOSE 168 (H) 12/12/2017   CHOL 147 12/12/2017   TRIG 68.0 12/12/2017   HDL 33.10 (L) 12/12/2017   LDLCALC 101 (H) 12/12/2017   ALT 17 12/12/2017   AST 17 12/12/2017   NA 138 12/12/2017   K 4.3 12/12/2017   CL 103 12/12/2017   CREATININE 1.29 12/12/2017   BUN 12 12/12/2017   CO2 29 12/12/2017  TSH 1.28 10/05/2015   HGBA1C 7.6 (H) 12/12/2017   MICROALBUR <0.7 12/12/2017    No results found.  Assessment & Plan:   [name] was seen today for annual exam and diabetes.  Diagnoses and all orders for this visit:  Type 2 diabetes mellitus with complication, without long-term current use of insulin (HCC)- His A1c is up to 7.6%.  I have therefore asked him to start taking metformin and a DPP4 inhibitor for blood sugar control.  His renal function is normal. -      Comprehensive metabolic panel; Future -     CBC with Differential/Platelet; Future -     Hemoglobin A1c; Future -     Urinalysis, Routine w reflex microscopic; Future -     Microalbumin / creatinine urine ratio; Future -     SitaGLIPtin-MetFORMIN HCl (JANUMET XR) [phone] MG TB24; Take 1 tablet by mouth daily.  Routine general medical examination at a health care facility-exam completed, labs reviewed, vaccines reviewed and updated, patient education material was given. -     Lipid panel; Future  Need for influenza vaccination -     Flu Vaccine QUAD 36+ mos IM   I am having Robert [name]. Ostrovsky start on SitaGLIPtin-MetFORMIN HCl.  Meds ordered this encounter  Medications  . SitaGLIPtin-MetFORMIN HCl (JANUMET XR) [phone] MG TB24    Sig: Take 1 tablet by mouth daily.    Dispense:  90 tablet    Refill:  1     Follow-up: Return in about 6 months (around 06/12/2018).  Sanda Lingerhomas Shota Kohrs, MD

## 2018-06-12 ENCOUNTER — Ambulatory Visit (INDEPENDENT_AMBULATORY_CARE_PROVIDER_SITE_OTHER): Payer: Managed Care, Other (non HMO) | Admitting: Internal Medicine

## 2018-06-12 ENCOUNTER — Encounter: Payer: Self-pay | Admitting: Internal Medicine

## 2018-06-12 ENCOUNTER — Ambulatory Visit: Payer: Managed Care, Other (non HMO) | Admitting: Internal Medicine

## 2018-06-12 VITALS — BP 110/80 | HR 80 | Temp 98.3°F | Resp 16 | Ht 72.0 in | Wt 238.0 lb

## 2018-06-12 DIAGNOSIS — E118 Type 2 diabetes mellitus with unspecified complications: Secondary | ICD-10-CM | POA: Diagnosis not present

## 2018-06-12 LAB — POCT GLYCOSYLATED HEMOGLOBIN (HGB A1C): HEMOGLOBIN A1C: 7.3 % — AB (ref 4.0–5.6)

## 2018-06-12 LAB — HM DIABETES FOOT EXAM

## 2018-06-12 MED ORDER — SITAGLIP PHOS-METFORMIN HCL ER 100-1000 MG PO TB24: 1.0000 | ORAL_TABLET | Freq: Every day | ORAL | 1 refills | Status: DC

## 2018-06-12 NOTE — Patient Instructions (Signed)

## 2018-06-12 NOTE — Progress Notes (Signed)
Subjective:  Patient ID: Robert [name], male    DOB: 1981/01/16  Age: 37 y.o. MRN: [id number]  CC: Diabetes   HPI Robert [name] presents for f/up - He is not taking Janumet.  He wanted to control his blood sugars with lifestyle modifications.  He has adhered to a low calorie high-protein diet and has lost weight.  He feels well today and offers no complaints.  He denies polys.  Outpatient Medications Prior to Visit  Medication Sig Dispense Refill  . SitaGLIPtin-MetFORMIN HCl (JANUMET XR) [phone] MG TB24 Take 1 tablet by mouth daily. (Patient not taking: Reported on 06/12/2018) 90 tablet 1   No facility-administered medications prior to visit.     ROS Review of Systems  Constitutional: Negative.  Negative for appetite change, diaphoresis, fatigue and unexpected weight change.  HENT: Negative.   Eyes: Negative for visual disturbance.  Respiratory: Negative for cough, chest tightness, shortness of breath and wheezing.   Cardiovascular: Negative for chest pain, palpitations and leg swelling.  Gastrointestinal: Negative for abdominal pain, constipation, diarrhea, nausea and vomiting.  Endocrine: Negative for polydipsia, polyphagia and polyuria.  Genitourinary: Negative.  Negative for difficulty urinating.  Musculoskeletal: Negative for back pain, myalgias and neck pain.  Skin: Negative.  Negative for color change and rash.  Neurological: Negative.  Negative for dizziness, weakness and light-headedness.  Hematological: Negative for adenopathy. Does not bruise/bleed easily.  Psychiatric/Behavioral: Negative.     Objective:  BP 110/80 (BP Location: Left Arm, Patient Position: Sitting, Cuff Size: Large)   Pulse 80   Temp 98.3 F (36.8 C) (Oral)   Resp 16   Ht 6' (1.829 m)   Wt 238 lb (108 kg)   SpO2 97%   BMI 32.28 kg/m   BP Readings from Last 3 Encounters:  06/12/18 110/80  12/12/17 118/84  10/05/15 118/78    Wt Readings from Last 3 Encounters:  06/12/18 238 lb (108 kg)    12/12/17 245 lb 8 oz (111.4 kg)  10/05/15 233 lb (105.7 kg)    Physical Exam  Constitutional: He is oriented to person, place, and time. No distress.  HENT:  Mouth/Throat: Oropharynx is clear and moist. No oropharyngeal exudate.  Eyes: Conjunctivae are normal. No scleral icterus.  Neck: Normal range of motion. Neck supple. No JVD present. No thyromegaly present.  Cardiovascular: Normal rate, regular rhythm and normal heart sounds.  No murmur heard. Pulmonary/Chest: Effort normal and breath sounds normal. He has no wheezes. He has no rales.  Abdominal: Soft. Normal appearance and bowel sounds are normal. He exhibits no mass. There is no hepatosplenomegaly. There is no tenderness. No hernia.  Musculoskeletal: Normal range of motion. He exhibits no edema, tenderness or deformity.  Lymphadenopathy:    He has no cervical adenopathy.  Neurological: He is alert and oriented to person, place, and time.  Skin: Skin is warm and dry. No rash noted. He is not diaphoretic.  Vitals reviewed.   Lab Results  Component Value Date   WBC 4.1 12/12/2017   HGB 15.6 12/12/2017   HCT 46.7 12/12/2017   PLT 187.0 12/12/2017   GLUCOSE 168 (H) 12/12/2017   CHOL 147 12/12/2017   TRIG 68.0 12/12/2017   HDL 33.10 (L) 12/12/2017   LDLCALC 101 (H) 12/12/2017   ALT 17 12/12/2017   AST 17 12/12/2017   NA 138 12/12/2017   K 4.3 12/12/2017   CL 103 12/12/2017   CREATININE 1.29 12/12/2017   BUN 12 12/12/2017   CO2 29 12/12/2017  TSH 1.28 10/05/2015   HGBA1C 7.3 (A) 06/12/2018   MICROALBUR <0.7 12/12/2017    No results found.  Assessment & Plan:   [name] was seen today for diabetes.  Diagnoses and all orders for this visit:  Type 2 diabetes mellitus with complication, without long-term current use of insulin (HCC)- His A1c is at 7.3%.  His blood sugars are not adequately well controlled.  He was praised for his lifestyle modifications but he also agrees to start taking Janumet. -     POCT  glycosylated hemoglobin (Hb A1C) -     SitaGLIPtin-MetFORMIN HCl (JANUMET XR) [phone] MG TB24; Take 1 tablet by mouth daily.   I am having Robert [name] maintain his SitaGLIPtin-MetFORMIN HCl.  Meds ordered this encounter  Medications  . SitaGLIPtin-MetFORMIN HCl (JANUMET XR) [phone] MG TB24    Sig: Take 1 tablet by mouth daily.    Dispense:  90 tablet    Refill:  1     Follow-up: Return in about 6 months (around 12/12/2018).  Sanda Lingerhomas Jones, MD

## 2018-12-16 ENCOUNTER — Encounter: Payer: Managed Care, Other (non HMO) | Admitting: Internal Medicine

## 2018-12-25 ENCOUNTER — Encounter: Payer: Managed Care, Other (non HMO) | Admitting: Internal Medicine

## 2018-12-25 DIAGNOSIS — Z0289 Encounter for other administrative examinations: Secondary | ICD-10-CM

## 2019-11-24 ENCOUNTER — Ambulatory Visit
Admission: EM | Admit: 2019-11-24 | Discharge: 2019-11-24 | Disposition: A | Discharge status: Home / Self Care | Payer: Managed Care, Other (non HMO) | Attending: Physician Assistant | Admitting: Physician Assistant

## 2019-11-24 ENCOUNTER — Other Ambulatory Visit: Payer: Self-pay

## 2019-11-24 DIAGNOSIS — Z202 Contact with and (suspected) exposure to infections with a predominantly sexual mode of transmission: Secondary | ICD-10-CM | POA: Diagnosis not present

## 2019-11-24 MED ORDER — AZITHROMYCIN 500 MG PO TABS: 1000.0000 mg | ORAL_TABLET | Freq: Once | ORAL | 0 refills | Status: AC

## 2019-11-24 NOTE — ED Triage Notes (Signed)
Pt state he was told by his lady friend is was positive Chlamydia. Pt denies any sx's. Last unprotective sexual intercourse was on the 11/11/2019.

## 2019-11-24 NOTE — ED Provider Notes (Signed)
[facility]    CSN: [id number] Arrival date & time: 11/24/19  0947      History   Chief Complaint Chief Complaint  Patient presents with  . Exposure to STD    HPI Robert [name] is a 38 y.o. male.   38 year old male comes in for STD testing after exposure.  States partner was tested positive for chlamydia.  He is currently asymptomatic.  Denies urinary symptoms such as frequency, dysuria, hematuria.  Denies testicular swelling, testicular pain, penile discharge, penile lesion.  Denies abdominal pain, nausea, vomiting, diarrhea.  Denies fever, chills, body aches.  Sexually active with one male partner, no condom use.     Past Medical History:  Diagnosis Date  . Asthma   . GERD (gastroesophageal reflux disease)     Patient Active Problem List   Diagnosis Date Noted  . Type II diabetes mellitus with manifestations ([place]) 08/17/2014  . Gastroesophageal reflux disease without esophagitis 08/17/2014  . OSA (obstructive sleep apnea) 01/05/2014  . Routine general medical examination at a health care facility 01/02/2014    History reviewed. No pertinent surgical history.     Home Medications    Prior to Admission medications   Medication Sig Start Date End Date Taking? Authorizing Provider  azithromycin (ZITHROMAX) 500 MG tablet Take 2 tablets (1,000 mg total) by mouth once for 1 dose. Take first 2 tablets together, then 1 every day until finished. 11/24/19 11/24/19  Ok Edwards, PA-C    Family History Family History  Problem Relation Age of Onset  . Hypertension Other   . Asthma Son   . Cancer Neg Hx   . Diabetes Neg Hx   . Early death Neg Hx   . Heart disease Neg Hx   . Hyperlipidemia Neg Hx   . Kidney disease Neg Hx   . Stroke Neg Hx     Social History Social History   Tobacco Use  . Smoking status: Never Smoker  . Smokeless tobacco: Never Used  Substance Use Topics  . Alcohol use: No    Alcohol/week: 0.0 standard drinks  . Drug use: No     Allergies   Patient has no known allergies.   Review of Systems Review of Systems  Reason unable to perform ROS: See HPI as above.     Physical Exam Triage Vital Signs ED Triage Vitals  Enc Vitals Group     BP 11/24/19 1003 136/90     Pulse Rate 11/24/19 1003 (!) 107     Resp 11/24/19 1003 16     Temp 11/24/19 1003 98.6 F (37 C)     Temp Source 11/24/19 1003 Oral     SpO2 11/24/19 1003 97 %     Weight --      Height --      Head Circumference --      Peak Flow --      Pain Score 11/24/19 1007 0     Pain Loc --      Pain Edu? --      Excl. in [place]? --    No data found.  Updated Vital Signs BP 136/90 (BP Location: Left Arm)   Pulse (!) 107   Temp 98.6 F (37 C) (Oral)   Resp 16   SpO2 97%   Physical Exam Constitutional:      General: He is not in acute distress.    Appearance: He is well-developed. He is not diaphoretic.  HENT:  Head: Normocephalic and atraumatic.  Eyes:     Conjunctiva/sclera: Conjunctivae normal.     Pupils: Pupils are equal, round, and reactive to light.  Pulmonary:     Effort: Pulmonary effort is normal. No respiratory distress.  Skin:    General: Skin is warm and dry.  Neurological:     Mental Status: He is alert and oriented to person, place, and time.    UC Treatments / Results  Labs (all labs ordered are listed, but only abnormal results are displayed) Labs Reviewed  CYTOLOGY, (ORAL, ANAL, URETHRAL) ANCILLARY ONLY    EKG   Radiology No results found.  Procedures Procedures (including critical care time)  Medications Ordered in UC Medications - No data to display  Initial Impression / Assessment and Plan / UC Course  I have reviewed the triage vital signs and the nursing notes.  Pertinent labs & imaging results that were available during my care of the patient were reviewed by me and considered in my medical decision making (see chart for details).    Patient was treated empirically for chlamydia.  Given  patient currently on empty stomach, will call azithromycin into pharmacy, and have patient take dosage after eating. Cytology sent, patient will be contacted with any positive results that require additional treatment. Patient to refrain from sexual activity for the next 7 days. Return precautions given.   Final Clinical Impressions(s) / UC Diagnoses   Final diagnoses:  STD exposure   ED Prescriptions    Medication Sig Dispense Auth. Provider   azithromycin (ZITHROMAX) 500 MG tablet Take 2 tablets (1,000 mg total) by mouth once for 1 dose. Take first 2 tablets together, then 1 every day until finished. 2 tablet Belinda Fisher, PA-C     PDMP not reviewed this encounter.   Belinda Fisher, PA-C 11/24/19 1041

## 2019-11-24 NOTE — Discharge Instructions (Signed)
You were treated empirically for chlamydia. Azithromycin 1g by mouth once, take after having some food.. Cytology sent, you will be contacted with any positive results that requires further treatment. Refrain from sexual activity and alcohol use for the next 7 days. If developing testicular swelling/pain, penile lesion/sore, follow up for reevaluation.

## 2019-11-25 LAB — CYTOLOGY, (ORAL, ANAL, URETHRAL) ANCILLARY ONLY
Chlamydia: POSITIVE — AB
Neisseria Gonorrhea: NEGATIVE
Trichomonas: NEGATIVE

## 2019-11-26 ENCOUNTER — Telehealth (HOSPITAL_COMMUNITY): Payer: Self-pay | Admitting: Emergency Medicine

## 2019-11-26 NOTE — Telephone Encounter (Signed)
Chlamydia is positive.  This was treated at the urgent care visit with po zithromax 1g.  Pt needs education to please refrain from sexual intercourse for 7 days to give the medicine time to work.  Sexual partners need to be notified and tested/treated.  Condoms may reduce risk of reinfection.  Recheck or followup with PCP for further evaluation if symptoms are not improving.  [facility] notified.  Patient contacted by phone and made aware of    results. Pt verbalized understanding and had all questions answered. Pt states he hasn't picked up meds and will pick them up today.

## 2019-11-27 ENCOUNTER — Telehealth: Payer: Self-pay | Admitting: Emergency Medicine

## 2019-11-27 NOTE — Telephone Encounter (Signed)
Pt called stating prescription did not go through with pharmacy from visit this week.  Called pharmacy to confirm, there was an issue with the writing of the prescription.  Confirmed needed changes with APP on site and pharmacy to fill prescription at this time.  Patient made aware.

## 2019-12-29 ENCOUNTER — Inpatient Hospital Stay
Admission: RE | Admit: 2019-12-29 | Discharge: 2019-12-29 | Disposition: A | Discharge status: Home / Self Care | Payer: Managed Care, Other (non HMO) | Source: Ambulatory Visit

## 2020-01-03 ENCOUNTER — Other Ambulatory Visit: Payer: Self-pay

## 2020-01-03 ENCOUNTER — Ambulatory Visit
Admission: EM | Admit: 2020-01-03 | Discharge: 2020-01-03 | Disposition: A | Discharge status: Home / Self Care | Payer: Managed Care, Other (non HMO) | Attending: Emergency Medicine | Admitting: Emergency Medicine

## 2020-01-03 ENCOUNTER — Encounter: Payer: Self-pay | Admitting: Emergency Medicine

## 2020-01-03 DIAGNOSIS — Z8619 Personal history of other infectious and parasitic diseases: Secondary | ICD-10-CM

## 2020-01-03 DIAGNOSIS — Z113 Encounter for screening for infections with a predominantly sexual mode of transmission: Secondary | ICD-10-CM | POA: Diagnosis present

## 2020-01-03 MED ORDER — AZITHROMYCIN 500 MG PO TABS
1000.0000 mg | ORAL_TABLET | Freq: Once | ORAL | Status: AC
  Administered 2020-01-03: 1000 mg via ORAL

## 2020-01-03 NOTE — ED Notes (Signed)
Patient able to ambulate independently  

## 2020-01-03 NOTE — Discharge Instructions (Signed)
Today you received treatment for chlamydia. °Testing for chlamydia, gonorrhea, trichomonas is pending: please look for these results on the MyChart app/website.  We will notify you if you are positive and outline treatment at that time. ° °Important to avoid all forms of sexual intercourse (oral, vaginal, anal) with any/all partners for the next 7 days to avoid spreading/reinfecting. °Any/all sexual partners should be notified of testing/treatment today. ° °Return for persistent/worsening symptoms or if you develop fever, abdominal or pelvic pain, blood in your urine, or are re-exposed to an STI. °

## 2020-01-03 NOTE — ED Provider Notes (Signed)
[facility]    CSN: [id number] Arrival date & time: 01/03/20  0931      History   Chief Complaint Chief Complaint  Patient presents with  . APPT: 930am  . STI Check    HPI DEVARIOUS PAVEK is a 39 y.o. male with history of asthma, GERD presenting for STD check.  States he was treated for chlamydia 1 month ago (per chart review 11/24/2019), though took medication on empty stomach and vomited it back up.  States emesis occurred due to lack of eating prior to taking medication despite counsel thereof at time of last visit.  Denied nausea preceding this: Emesis occurred about 20 minutes after taking medication.  Does endorse intermittent burning sensation when he urinates.  Has remained abstinent since last visit.  Denies testicular or penile pain/swelling, penile discharge.    Past Medical History:  Diagnosis Date  . Asthma   . GERD (gastroesophageal reflux disease)     Patient Active Problem List   Diagnosis Date Noted  . 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 Gastroesophageal reflux disease without esophagitis 08/17/2014  . OSA (obstructive sleep apnea) 01/05/2014  . Routine general medical examination at a health care facility 01/02/2014    History reviewed. No pertinent surgical history.     Home Medications    Prior to Admission medications   Not on File    Family History Family History  Problem Relation Age of Onset  . Hypertension Other   . Asthma Son   . Cancer Neg Hx   . Diabetes Neg Hx   . Early death Neg Hx   . Heart disease Neg Hx   . Hyperlipidemia Neg Hx   . Kidney disease Neg Hx   . Stroke Neg Hx     Social History Social History   Tobacco Use  . Smoking status: Never Smoker  . Smokeless tobacco: Never Used  Substance Use Topics  . Alcohol use: No    Alcohol/week: 0.0 standard drinks  . Drug use: No     Allergies   Patient has no known allergies.   Review of Systems As per HPI   Physical  Exam Triage Vital Signs ED Triage Vitals [01/03/20 0938]  Enc Vitals Group     BP (!) 134/96     Pulse Rate 100     Resp 16     Temp 98.7 F (37.1 C)     Temp Source Oral     SpO2 98 %     Weight      Height      Head Circumference      Peak Flow      Pain Score 0     Pain Loc      Pain Edu?      Excl. in [place]?    No data found.  Updated Vital Signs BP (!) 134/96 (BP Location: Left Arm)   Pulse 100   Temp 98.7 F (37.1 C) (Oral)   Resp 16   SpO2 98%   Visual Acuity Right Eye Distance:   Left Eye Distance:   Bilateral Distance:    Right Eye Near:   Left Eye Near:    Bilateral Near:     Physical Exam Constitutional:      General: He is not in acute distress. HENT:     Head: Normocephalic and atraumatic.  Eyes:     General: No scleral icterus.    Pupils: Pupils are equal, round,  and reactive to light.  Cardiovascular:     Rate and Rhythm: Normal rate.  Pulmonary:     Effort: Pulmonary effort is normal. No respiratory distress.     Breath sounds: No wheezing.  Genitourinary:    Comments: Patient deferred: Self swab performed Skin:    Coloration: Skin is not jaundiced or pale.  Neurological:     Mental Status: He is alert and oriented to person, place, and time.      UC Treatments / Results  Labs (all labs ordered are listed, but only abnormal results are displayed) Labs Reviewed  CYTOLOGY, (ORAL, ANAL, URETHRAL) ANCILLARY ONLY    EKG   Radiology No results found.  Procedures Procedures (including critical care time)  Medications Ordered in UC Medications  azithromycin (ZITHROMAX) tablet 1,000 mg (1,000 mg Oral Given 01/03/20 0947)    Initial Impression / Assessment and Plan / UC Course  I have reviewed the triage vital signs and the nursing notes.  Pertinent labs & imaging results that were available during my care of the patient were reviewed by me and considered in my medical decision making (see chart for details).     Patient with  mild symptoms: No discharge.  Given emesis under 1 hour of taking medications, will treat empirically for chlamydia today.  Patient given 1 g azithromycin which he tolerated well.  Patient denied nausea prior to discharge, stating that he ate before coming today.  Cytology pending, will treat any concurrent infection if needed.  Return precautions discussed, patient verbalized understanding and is agreeable to plan. Final Clinical Impressions(s) / UC Diagnoses   Final diagnoses:  History of chlamydia infection  Screening examination for venereal disease     Discharge Instructions     Today you received treatment for chlamydia. Testing for chlamydia, gonorrhea, trichomonas is pending: please look for these results on the MyChart app/website.  We will notify you if you are positive and outline treatment at that time.  Important to avoid all forms of sexual intercourse (oral, vaginal, anal) with any/all partners for the next 7 days to avoid spreading/reinfecting. Any/all sexual partners should be notified of testing/treatment today.  Return for persistent/worsening symptoms or if you develop fever, abdominal or pelvic pain, blood in your urine, or are re-exposed to an STI.    ED Prescriptions    None     PDMP not reviewed this encounter.   Hall-Potvin, [place], [place] 01/03/20 1004

## 2020-01-03 NOTE — ED Triage Notes (Signed)
Pt presents to [facility] for assessment after being treated chlamydia approx 1 month ago.  Patient states he took the medication on an empty stomach and vomited it back up, did not call for a refill at that time.  Continues to c.o of burning sensation sometimes when he urinates.

## 2020-01-06 ENCOUNTER — Telehealth (HOSPITAL_COMMUNITY): Payer: Self-pay | Admitting: Emergency Medicine

## 2020-01-06 LAB — CYTOLOGY, (ORAL, ANAL, URETHRAL) ANCILLARY ONLY
Chlamydia: POSITIVE — AB
Neisseria Gonorrhea: NEGATIVE
Trichomonas: POSITIVE — AB

## 2020-01-06 MED ORDER — METRONIDAZOLE 500 MG PO TABS: 2000.0000 mg | ORAL_TABLET | Freq: Once | ORAL | 0 refills | Status: AC

## 2020-01-06 NOTE — Telephone Encounter (Signed)
Trichomonas is positive. Rx  for Flagyl 2 grams, once was sent to the pharmacy of record. Pt needs education to refrain from sexual intercourse for 7 days to give the medicine time to work. Sexual partners need to be notified and tested/treated. Condoms may reduce risk of reinfection. Recheck for further evaluation if symptoms are not improving.   Chlamydia is positive.  This was treated at the urgent care visit with po zithromax 1g.  Please refrain from sexual intercourse for 7 days to give the medicine time to work.  Sexual partners need to be notified and tested/treated.  Condoms may reduce risk of reinfection.  Recheck or followup with PCP for further evaluation if symptoms are not improving.  [facility] notified.  Patient contacted by phone and made aware of    results. Pt verbalized understanding and had all questions answered.

## 2020-02-18 ENCOUNTER — Ambulatory Visit
Admission: EM | Admit: 2020-02-18 | Discharge: 2020-02-18 | Disposition: A | Discharge status: Home / Self Care | Payer: Managed Care, Other (non HMO) | Attending: Emergency Medicine | Admitting: Emergency Medicine

## 2020-02-18 DIAGNOSIS — R300 Dysuria: Secondary | ICD-10-CM | POA: Diagnosis present

## 2020-02-18 DIAGNOSIS — Z8619 Personal history of other infectious and parasitic diseases: Secondary | ICD-10-CM | POA: Diagnosis present

## 2020-02-18 DIAGNOSIS — Z7251 High risk heterosexual behavior: Secondary | ICD-10-CM | POA: Insufficient documentation

## 2020-02-18 MED ORDER — METRONIDAZOLE 500 MG PO TABS
2000.0000 mg | ORAL_TABLET | Freq: Once | ORAL | Status: AC
  Administered 2020-02-18: 2000 mg via ORAL

## 2020-02-18 MED ORDER — AZITHROMYCIN 500 MG PO TABS
1000.0000 mg | ORAL_TABLET | Freq: Once | ORAL | Status: AC
  Administered 2020-02-18: 1000 mg via ORAL

## 2020-02-18 MED ORDER — METRONIDAZOLE 500 MG PO TABS: 2000.0000 mg | ORAL_TABLET | Freq: Once | ORAL | 0 refills | Status: AC

## 2020-02-18 MED ORDER — CEFTRIAXONE SODIUM 500 MG IJ SOLR
500.0000 mg | Freq: Once | INTRAMUSCULAR | Status: AC
  Administered 2020-02-18: 500 mg via INTRAMUSCULAR

## 2020-02-18 MED ORDER — AZITHROMYCIN 500 MG PO TABS: 1000.0000 mg | ORAL_TABLET | Freq: Every day | ORAL | 0 refills | Status: DC

## 2020-02-18 NOTE — Discharge Instructions (Addendum)
Today you received treatment for chlamydia, gonorrhea, trichomonas. Testing for chlamydia, gonorrhea, trichomonas is pending: please look for these results on the MyChart app/website.  We will notify you if you are positive and outline treatment at that time.  Important to avoid all forms of sexual intercourse (oral, vaginal, anal) with any/all partners for the next 7 days to avoid spreading/reinfecting. Any/all sexual partners should be notified of testing/treatment today.  Return for persistent/worsening symptoms or if you develop fever, abdominal or pelvic pain, blood in your urine, or are re-exposed to an STI.

## 2020-02-18 NOTE — ED Provider Notes (Signed)
[facility]    CSN: [id number] Arrival date & time: 02/18/20  1502      History   Chief Complaint Chief Complaint  Patient presents with  . SEXUALLY TRANSMITTED DISEASE    HPI Robert [name] is a 39 y.o. male presenting for STD testing.  Patient previously seen by me on 01/03/2020: Treated empirically for chlamydia given positive test from 12/7 with emesis after taking antibiotic at home.  Patient states he kept down medications at 1/16 appointment.  Also tested positive for trichomonas: Was given 2 g Flagyl at home which he states he took.  Patient states that he "went back to"same partner.  States that she "says she got treated, but I am not sure ".  Patient denying penile discharge, pain, testicular pain or swelling, abdominal pain, fever.  Has had mild tingling when he urinates for a few days.  No blood in urine, urinary frequency urgency.  Past Medical History:  Diagnosis Date  . Asthma   . GERD (gastroesophageal reflux disease)     Patient Active Problem List   Diagnosis Date Noted  . Type II diabetes mellitus with manifestations ([facility]) 08/17/2014  . Gastroesophageal reflux disease without esophagitis 08/17/2014  . OSA (obstructive sleep apnea) 01/05/2014  . Routine general medical examination at a health care facility 01/02/2014    History reviewed. No pertinent surgical history.     Home Medications    Prior to Admission medications   Medication Sig Start Date End Date Taking? Authorizing Provider  azithromycin (ZITHROMAX) 500 MG tablet Take 2 tablets (1,000 mg total) by mouth daily. Take first 2 tablets together, then 1 every day until finished. 02/18/20   Hall-Potvin, [place], PA-C  metroNIDAZOLE (FLAGYL) 500 MG tablet Take 4 tablets (2,000 mg total) by mouth once for 1 dose. 02/18/20 02/18/20  Hall-Potvin, [place], PA-C    Family History Family History  Problem Relation Age of Onset  . Hypertension Other   . Asthma Son   . Cancer Neg Hx   . Diabetes  Neg Hx   . Early death Neg Hx   . Heart disease Neg Hx   . Hyperlipidemia Neg Hx   . Kidney disease Neg Hx   . Stroke Neg Hx     Social History Social History   Tobacco Use  . Smoking status: Never Smoker  . Smokeless tobacco: Never Used  Substance Use Topics  . Alcohol use: No    Alcohol/week: 0.0 standard drinks  . Drug use: No     Allergies   Patient has no known allergies.   Review of Systems As per HPI   Physical Exam Triage Vital Signs ED Triage Vitals  Enc Vitals Group     BP 02/18/20 1508 139/82     Pulse Rate 02/18/20 1508 (!) 117     Resp 02/18/20 1508 18     Temp 02/18/20 1508 99 F (37.2 C)     Temp Source 02/18/20 1508 Oral     SpO2 02/18/20 1508 98 %     Weight --      Height --      Head Circumference --      Peak Flow --      Pain Score 02/18/20 1509 0     Pain Loc --      Pain Edu? --      Excl. in [place]? --    No data found.  Updated Vital Signs BP 139/82 (BP Location: Left Arm)   Pulse Marland Kitchen)  113   Temp 99 F (37.2 C) (Oral)   Resp 18   SpO2 98%   Visual Acuity Right Eye Distance:   Left Eye Distance:   Bilateral Distance:    Right Eye Near:   Left Eye Near:    Bilateral Near:     Physical Exam Constitutional:      General: He is not in acute distress. HENT:     Head: Normocephalic and atraumatic.  Eyes:     General: No scleral icterus.    Pupils: Pupils are equal, round, and reactive to light.  Cardiovascular:     Rate and Rhythm: Regular rhythm. Tachycardia present.     Heart sounds: No murmur. No gallop.   Pulmonary:     Effort: Pulmonary effort is normal. No respiratory distress.     Breath sounds: No wheezing.  Genitourinary:    Comments: Deferred, self swab performed Skin:    Coloration: Skin is not jaundiced or pale.  Neurological:     Mental Status: He is alert and oriented to person, place, and time.      UC Treatments / Results  Labs (all labs ordered are listed, but only abnormal results are  displayed) Labs Reviewed  HIV ANTIBODY (ROUTINE TESTING W REFLEX)  RPR  CYTOLOGY, (ORAL, ANAL, URETHRAL) ANCILLARY ONLY    EKG   Radiology No results found.  Procedures Procedures (including critical care time)  Medications Ordered in UC Medications  cefTRIAXone (ROCEPHIN) injection 500 mg (has no administration in time range)  azithromycin (ZITHROMAX) tablet 1,000 mg (has no administration in time range)  metroNIDAZOLE (FLAGYL) tablet 2,000 mg (has no administration in time range)    Initial Impression / Assessment and Plan / UC Course  I have reviewed the triage vital signs and the nursing notes.  Pertinent labs & imaging results that were available during my care of the patient were reviewed by me and considered in my medical decision making (see chart for details).     Patient afebrile, nontoxic and with a few day course of dysuria: Otherwise ROS unremarkable.  Patient has now been seen 3 times since 12/7 for STI exposure and treatments.  Does return reportedly to 1 partner.  Will treat empirically for chlamydia, trichomonas, gonorrhea.  Patient tolerated medications well in office.  Cytology, HIV, RPR pending.  Patient denies history of HIV or syphilis.  Provide expedited partner treatment for partner, whom patient states he is "done with", and have patient follow-up with ID for further evaluation/management as patient could have resistant strain of chlamydia.  Patient attributing tachycardia to feeling stressed: Denying chest pain, palpitations or difficulty breathing.  Return precautions discussed, patient verbalized understanding and is agreeable to plan. Final Clinical Impressions(s) / UC Diagnoses   Final diagnoses:  History of trichomoniasis  History of chlamydia infection  Unprotected sex  Dysuria     Discharge Instructions     Today you received treatment for chlamydia, gonorrhea, trichomonas. Testing for chlamydia, gonorrhea, trichomonas is pending: please  look for these results on the MyChart app/website.  We will notify you if you are positive and outline treatment at that time.  Important to avoid all forms of sexual intercourse (oral, vaginal, anal) with any/all partners for the next 7 days to avoid spreading/reinfecting. Any/all sexual partners should be notified of testing/treatment today.  Return for persistent/worsening symptoms or if you develop fever, abdominal or pelvic pain, blood in your urine, or are re-exposed to an STI.    ED Prescriptions  Medication Sig Dispense Auth. Provider   metroNIDAZOLE (FLAGYL) 500 MG tablet Take 4 tablets (2,000 mg total) by mouth once for 1 dose. 4 tablet Hall-Potvin, [place], PA-C   azithromycin (ZITHROMAX) 500 MG tablet Take 2 tablets (1,000 mg total) by mouth daily. Take first 2 tablets together, then 1 every day until finished. 2 tablet Hall-Potvin, [place], PA-C     PDMP not reviewed this encounter.   Hall-Potvin, [place], [place] 02/18/20 1541

## 2020-02-18 NOTE — ED Triage Notes (Signed)
Pt states having a tingling feeling when he urinates. States same feeling when he chlamydia a few months ago.

## 2020-02-19 LAB — HIV ANTIBODY (ROUTINE TESTING W REFLEX): HIV Screen 4th Generation wRfx: NONREACTIVE

## 2020-02-19 LAB — RPR: RPR Ser Ql: NONREACTIVE

## 2020-02-20 LAB — CYTOLOGY, (ORAL, ANAL, URETHRAL) ANCILLARY ONLY
Chlamydia: NEGATIVE
Neisseria Gonorrhea: NEGATIVE
Trichomonas: NEGATIVE

## 2020-08-05 ENCOUNTER — Ambulatory Visit (INDEPENDENT_AMBULATORY_CARE_PROVIDER_SITE_OTHER): Payer: Managed Care, Other (non HMO) | Admitting: Internal Medicine

## 2020-08-05 ENCOUNTER — Encounter: Payer: Self-pay | Admitting: Internal Medicine

## 2020-08-05 ENCOUNTER — Other Ambulatory Visit: Payer: Self-pay

## 2020-08-05 VITALS — BP 110/80 | HR 90 | Temp 98.5°F | Ht 72.0 in | Wt 205.0 lb

## 2020-08-05 DIAGNOSIS — E118 Type 2 diabetes mellitus with unspecified complications: Secondary | ICD-10-CM | POA: Diagnosis not present

## 2020-08-05 DIAGNOSIS — E139 Other specified diabetes mellitus without complications: Secondary | ICD-10-CM | POA: Diagnosis not present

## 2020-08-05 DIAGNOSIS — Z23 Encounter for immunization: Secondary | ICD-10-CM

## 2020-08-05 DIAGNOSIS — S39012A Strain of muscle, fascia and tendon of lower back, initial encounter: Secondary | ICD-10-CM | POA: Diagnosis not present

## 2020-08-05 DIAGNOSIS — Z0000 Encounter for general adult medical examination without abnormal findings: Secondary | ICD-10-CM | POA: Diagnosis not present

## 2020-08-05 DIAGNOSIS — E785 Hyperlipidemia, unspecified: Secondary | ICD-10-CM

## 2020-08-05 LAB — POCT GLYCOSYLATED HEMOGLOBIN (HGB A1C): Hemoglobin A1C: 13.3 % — AB (ref 4.0–5.6)

## 2020-08-05 MED ORDER — TIZANIDINE HCL 2 MG PO CAPS: 2.0000 mg | ORAL_CAPSULE | Freq: Three times a day (TID) | ORAL | 0 refills | Status: DC | PRN

## 2020-08-05 MED ORDER — FREESTYLE LIBRE 14 DAY READER DEVI: 1.0000 | Freq: Every day | 5 refills | Status: DC

## 2020-08-05 MED ORDER — FREESTYLE LIBRE 14 DAY SENSOR MISC: 1.0000 | Freq: Every day | 5 refills | Status: DC

## 2020-08-05 MED ORDER — ACCU-CHEK GUIDE ME W/DEVICE KIT: 1.0000 | PACK | Freq: Three times a day (TID) | 0 refills | Status: AC

## 2020-08-05 MED ORDER — MELOXICAM 15 MG PO TABS: 15.0000 mg | ORAL_TABLET | Freq: Every day | ORAL | 0 refills | Status: DC

## 2020-08-05 MED ORDER — BASAGLAR KWIKPEN 100 UNIT/ML ~~LOC~~ SOPN: 50.0000 [IU] | PEN_INJECTOR | Freq: Every day | SUBCUTANEOUS | 0 refills | Status: DC

## 2020-08-05 NOTE — Patient Instructions (Signed)
Acute Back Pain, Adult Acute back pain is sudden and usually short-lived. It is often caused by an injury to the muscles and tissues in the back. The injury may result from:  A muscle or ligament getting overstretched or torn (strained). Ligaments are tissues that connect bones to each other. Lifting something improperly can cause a back strain.  Wear and tear (degeneration) of the spinal disks. Spinal disks are circular tissue that provides cushioning between the bones of the spine (vertebrae).  Twisting motions, such as while playing sports or doing yard work.  A hit to the back.  Arthritis. You may have a physical exam, lab tests, and imaging tests to find the cause of your pain. Acute back pain usually goes away with rest and home care. Follow these instructions at home: Managing pain, stiffness, and swelling  Take over-the-counter and prescription medicines only as told by your health care provider.  Your health care provider may recommend applying ice during the first 24-48 hours after your pain starts. To do this: ? Put ice in a plastic bag. ? Place a towel between your skin and the bag. ? Leave the ice on for 20 minutes, 2-3 times a day.  If directed, apply heat to the affected area as often as told by your health care provider. Use the heat source that your health care provider recommends, such as a moist heat pack or a heating pad. ? Place a towel between your skin and the heat source. ? Leave the heat on for 20-30 minutes. ? Remove the heat if your skin turns bright red. This is especially important if you are unable to feel pain, heat, or cold. You have a greater risk of getting burned. Activity   Do not stay in bed. Staying in bed for more than 1-2 days can delay your recovery.  Sit up and stand up straight. Avoid leaning forward when you sit, or hunching over when you stand. ? If you work at a desk, sit close to it so you do not need to lean over. Keep your chin tucked  in. Keep your neck drawn back, and keep your elbows bent at a right angle. Your arms should look like the letter "L." ? Sit high and close to the steering wheel when you drive. Add lower back (lumbar) support to your car seat, if needed.  Take short walks on even surfaces as soon as you are able. Try to increase the length of time you walk each day.  Do not sit, drive, or stand in one place for more than 30 minutes at a time. Sitting or standing for long periods of time can put stress on your back.  Do not drive or use heavy machinery while taking prescription pain medicine.  Use proper lifting techniques. When you bend and lift, use positions that put less stress on your back: ? Bend your knees. ? Keep the load close to your body. ? Avoid twisting.  Exercise regularly as told by your health care provider. Exercising helps your back heal faster and helps prevent back injuries by keeping muscles strong and flexible.  Work with a physical therapist to make a safe exercise program, as recommended by your health care provider. Do any exercises as told by your physical therapist. Lifestyle  Maintain a healthy weight. Extra weight puts stress on your back and makes it difficult to have good posture.  Avoid activities or situations that make you feel anxious or stressed. Stress and anxiety increase muscle   tension and can make back pain worse. Learn ways to manage anxiety and stress, such as through exercise. General instructions  Sleep on a firm mattress in a comfortable position. Try lying on your side with your knees slightly bent. If you lie on your back, put a pillow under your knees.  Follow your treatment plan as told by your health care provider. This may include: ? Cognitive or behavioral therapy. ? Acupuncture or massage therapy. ? Meditation or yoga. Contact a health care provider if:  You have pain that is not relieved with rest or medicine.  You have increasing pain going down  into your legs or buttocks.  Your pain does not improve after 2 weeks.  You have pain at night.  You lose weight without trying.  You have a fever or chills. Get help right away if:  You develop new bowel or bladder control problems.  You have unusual weakness or numbness in your arms or legs.  You develop nausea or vomiting.  You develop abdominal pain.  You feel faint. Summary  Acute back pain is sudden and usually short-lived.  Use proper lifting techniques. When you bend and lift, use positions that put less stress on your back.  Take over-the-counter and prescription medicines and apply heat or ice as directed by your health care provider. This information is not intended to replace advice given to you by your health care provider. Make sure you discuss any questions you have with your health care provider. Document Revised: 03/25/2019 Document Reviewed: 07/18/2017 [employer] Patient Education  2020 [employer]  

## 2020-08-05 NOTE — Progress Notes (Signed)
Subjective:  Patient ID: Robert [name], male    DOB: [birth year]  Age: 39 y.o. MRN: [id number]  CC: Annual Exam, Diabetes, Back Pain, and Hyperlipidemia  This visit occurred during the SARS-CoV-2 public health emergency.  Safety protocols were in place, including screening questions prior to the visit, additional usage of staff PPE, and extensive cleaning of exam room while observing appropriate contact time as indicated for disinfecting solutions.    HPI Robert [name] presents for a CPX.  [name] and feels like he strained a muscle in his lower back.  He has an aching in his lower back that radiates into his thighs.  He denies paresthesias in his lower extremities.  He says the pain is worsened by movement.  He is not monitoring it or treating his blood sugar.  He complains of polys.  He denies dizziness, lightheadedness, chest pain, shortness of breath, edema, or fatigue.  Outpatient Medications Prior to Visit  Medication Sig Dispense Refill   azithromycin (ZITHROMAX) 500 MG tablet Take 2 tablets (1,000 mg total) by mouth daily. Take first 2 tablets together, then 1 every day until finished. 2 tablet 0   No facility-administered medications prior to visit.    ROS Review of Systems  Constitutional: Positive for unexpected weight change (wt loss). Negative for appetite change, chills, diaphoresis and fatigue.  HENT: Negative.   Eyes: Negative for visual disturbance.  Respiratory: Negative for cough, chest tightness, shortness of breath and wheezing.   Cardiovascular: Negative for chest pain, palpitations and leg swelling.  Gastrointestinal: Negative for abdominal pain, constipation, diarrhea, nausea and vomiting.  Endocrine: Positive for polydipsia, polyphagia and polyuria. Negative for cold intolerance and heat intolerance.  Genitourinary: Negative for difficulty urinating, hematuria, scrotal swelling, testicular pain and urgency.  Musculoskeletal:  Positive for back pain. Negative for arthralgias and neck stiffness.  Skin: Negative.  Negative for rash.  Neurological: Negative.  Negative for dizziness, syncope, weakness, light-headedness, numbness and headaches.  Hematological: Negative for adenopathy. Does not bruise/bleed easily.  Psychiatric/Behavioral: Negative.     Objective:  BP 110/80 (BP Location: Left Arm, Patient Position: Sitting, Cuff Size: Large)    Pulse 90    Temp 98.5 F (36.9 C) (Oral)    Ht 6' (1.829 m)    Wt 205 lb (93 kg)    SpO2 97%    BMI 27.80 kg/m   BP Readings from Last 3 Encounters:  08/05/20 110/80  02/18/20 139/82  01/03/20 (!) 134/96    Wt Readings from Last 3 Encounters:  08/05/20 205 lb (93 kg)  06/12/18 238 lb (108 kg)  12/12/17 245 lb 8 oz (111.4 kg)    Physical Exam Vitals reviewed.  Constitutional:      General: He is not in acute distress.    Appearance: Normal appearance. He is not ill-appearing, toxic-appearing or diaphoretic.  HENT:     Nose: Nose normal.     Mouth/Throat:     Mouth: Mucous membranes are moist.     Pharynx: No oropharyngeal exudate.  Eyes:     General: No scleral icterus.    Conjunctiva/sclera: Conjunctivae normal.  Cardiovascular:     Rate and Rhythm: Normal rate and regular rhythm.     Heart sounds: No murmur heard.   Pulmonary:     Effort: Pulmonary effort is normal.     Breath sounds: No stridor. No wheezing, rhonchi or rales.  Abdominal:     General: Abdomen is flat.  Palpations: There is no mass.     Tenderness: There is no abdominal tenderness. There is no guarding.     Hernia: There is no hernia in the left inguinal area or right inguinal area.  Genitourinary:    Pubic Area: No rash.      Penis: Normal.      Testes: Normal.        Right: Mass not present.        Left: Mass not present.     Epididymis:     Right: Normal. No mass.     Left: Normal. No mass.     Prostate: Normal. Not enlarged, not tender and no nodules present.     Rectum:  Normal. Guaiac result negative. No mass.  Musculoskeletal:        General: Normal range of motion.     Cervical back: Normal and neck supple.     Thoracic back: Normal.     Lumbar back: Normal. No swelling, edema, spasms, tenderness or bony tenderness. Normal range of motion. Negative right straight leg raise test and negative left straight leg raise test.     Right lower leg: No edema.     Left lower leg: No edema.  Lymphadenopathy:     Cervical: No cervical adenopathy.     Lower Body: No right inguinal adenopathy. No left inguinal adenopathy.  Skin:    General: Skin is warm and dry.     Coloration: Skin is not pale.  Neurological:     General: No focal deficit present.     Mental Status: He is alert and oriented to person, place, and time. Mental status is at baseline.     Cranial Nerves: No cranial nerve deficit.     Sensory: No sensory deficit.     Motor: No weakness.     Coordination: Coordination normal.     Gait: Gait normal.     Deep Tendon Reflexes: Reflexes are normal and symmetric. Reflexes normal.  Psychiatric:        Mood and Affect: Mood normal.        Behavior: Behavior normal.     Lab Results  Component Value Date   WBC 5.1 08/05/2020   HGB 15.5 08/05/2020   HCT 46.2 08/05/2020   PLT 204 08/05/2020   GLUCOSE 446 (H) 08/05/2020   CHOL 152 08/05/2020   TRIG 104 08/05/2020   HDL 35 (L) 08/05/2020   LDLCALC 97 08/05/2020   ALT 11 08/05/2020   AST 12 08/05/2020   NA 135 08/05/2020   K 4.4 08/05/2020   CL 100 08/05/2020   CREATININE 1.09 08/05/2020   BUN 17 08/05/2020   CO2 27 08/05/2020   TSH 1.50 08/05/2020   PSA 1.5 08/05/2020   HGBA1C 13.3 (A) 08/05/2020   MICROALBUR 2.1 08/05/2020    No results found.  Assessment & Plan:   Robert [name] was seen today for annual exam, diabetes, back pain and hyperlipidemia.  Diagnoses and all orders for this visit:  Type II diabetes mellitus with manifestations ([place]) -     CBC with Differential/Platelet;  Future -     BASIC METABOLIC PANEL WITH GFR; Future -     Hepatic function panel; Future -     TSH; Future -     Urinalysis, Routine w reflex microscopic; Future -     Microalbumin / creatinine urine ratio; Future -     Blood Glucose Monitoring Suppl (ACCU-CHEK GUIDE ME) w/Device KIT; 1 Act by Does not apply  route in the morning, at noon, and at bedtime. -     Amb Referral to Nutrition and Diabetic E -     Ambulatory referral to Ophthalmology -     Ambulatory referral to Endocrinology -     Insulin Glargine (BASAGLAR KWIKPEN) 100 UNIT/ML; Inject 0.5 mLs (50 Units total) into the skin daily. -     C-peptide; Future -     C-peptide -     Microalbumin / creatinine urine ratio -     Urinalysis, Routine w reflex microscopic -     TSH -     Hepatic function panel -     BASIC METABOLIC PANEL WITH GFR -     CBC with Differential/Platelet -     Continuous Blood Gluc Receiver (FREESTYLE LIBRE 14 DAY READER) DEVI; 1 Act by Does not apply route daily. -     Continuous Blood Gluc Sensor (FREESTYLE LIBRE 14 DAY SENSOR) MISC; 1 Act by Does not apply route daily. -     POCT glycosylated hemoglobin (Hb A1C)  Routine general medical examination at a health care facility- Exam completed, labs reviewed, vaccines reviewed and updated- he deferred on an pneumonia vaccine, pt ed material was given. -     Lipid panel; Future -     PSA; Future -     PSA -     Lipid panel  Lumbar strain, initial encounter- He is neurologically intact. Will treat with an nsaid and muscle relaxer. -     meloxicam (MOBIC) 15 MG tablet; Take 1 tablet (15 mg total) by mouth daily. -     tizanidine (ZANAFLEX) 2 MG capsule; Take 1 capsule (2 mg total) by mouth 3 (three) times daily as needed for muscle spasms.  Need for Tdap vaccination -     Tdap vaccine greater than or equal to 7yo IM  Diabetes 1.5, managed as type 1 ([place])- His C-peptide level is low. I recommended that he treat this with basal/bolus insulin and monitor with a  CGM. -     insulin lispro (HUMALOG KWIKPEN) 200 UNIT/ML KwikPen; Inject 10 Units into the skin with breakfast, with lunch, and with evening meal. -     Insulin Pen Needle (NOVOFINE) 32G X 6 MM MISC; 1 Act by Does not apply route in the morning, at noon, in the evening, and at bedtime.  Dyslipidemia, goal LDL below 70- I recommended that he take a statin and asa for CV risk reduction. -     rosuvastatin (CRESTOR) 5 MG tablet; Take 1 tablet (5 mg total) by mouth daily. -     aspirin EC 81 MG tablet; Take 1 tablet (81 mg total) by mouth daily.   I have discontinued Nikos D. Jurado's azithromycin. I am also having him start on meloxicam, Accu-Chek Guide Me, tizanidine, Basaglar KwikPen, FreeStyle Libre 14 Day Reader, [employer] 14 Day Sensor, HumaLOG KwikPen, rosuvastatin, aspirin EC, and NovoFine.  Meds ordered this encounter  Medications   meloxicam (MOBIC) 15 MG tablet    Sig: Take 1 tablet (15 mg total) by mouth daily.    Dispense:  30 tablet    Refill:  0   Blood Glucose Monitoring Suppl (ACCU-CHEK GUIDE ME) w/Device KIT    Sig: 1 Act by Does not apply route in the morning, at noon, and at bedtime.    Dispense:  1 kit    Refill:  0   tizanidine (ZANAFLEX) 2 MG capsule    Sig: Take 1 capsule (  2 mg total) by mouth 3 (three) times daily as needed for muscle spasms.    Dispense:  90 capsule    Refill:  0   Insulin Glargine (BASAGLAR KWIKPEN) 100 UNIT/ML    Sig: Inject 0.5 mLs (50 Units total) into the skin daily.    Dispense:  3 mL    Refill:  0   Continuous Blood Gluc Receiver (FREESTYLE LIBRE 14 DAY READER) DEVI    Sig: 1 Act by Does not apply route daily.    Dispense:  2 each    Refill:  5   Continuous Blood Gluc Sensor (FREESTYLE LIBRE 14 DAY SENSOR) MISC    Sig: 1 Act by Does not apply route daily.    Dispense:  2 each    Refill:  5   insulin lispro (HUMALOG KWIKPEN) 200 UNIT/ML KwikPen    Sig: Inject 10 Units into the skin with breakfast, with lunch, and with  evening meal.    Dispense:  9 mL    Refill:  1   rosuvastatin (CRESTOR) 5 MG tablet    Sig: Take 1 tablet (5 mg total) by mouth daily.    Dispense:  90 tablet    Refill:  1   aspirin EC 81 MG tablet    Sig: Take 1 tablet (81 mg total) by mouth daily.    Dispense:  90 tablet    Refill:  1   Insulin Pen Needle (NOVOFINE) 32G X 6 MM MISC    Sig: 1 Act by Does not apply route in the morning, at noon, in the evening, and at bedtime.    Dispense:  100 each    Refill:  5   In addition to time spent on CPE, I spent 50 minutes in preparing to see the patient by review of recent labs, imaging and procedures, obtaining and reviewing separately obtained history, communicating with the patient and family or caregiver, ordering medications, tests or procedures, and documenting clinical information in the EHR including the differential Dx, treatment, and any further evaluation and other management of 1. Type II diabetes mellitus with manifestations ([place]) 2. Lumbar strain, initial encounter 3. Diabetes 1.5, managed as type 1 ([place]) 4. Dyslipidemia, goal LDL below 70     Follow-up: Return in about 4 weeks (around 09/02/2020).  Scarlette Calico, MD

## 2020-08-06 ENCOUNTER — Encounter: Payer: Self-pay | Admitting: Internal Medicine

## 2020-08-06 LAB — BASIC METABOLIC PANEL WITH GFR
BUN: 17 mg/dL (ref 7–25)
CO2: 27 mmol/L (ref 20–32)
Calcium: 9.8 mg/dL (ref 8.6–10.3)
Chloride: 100 mmol/L (ref 98–110)
Creat: 1.09 mg/dL (ref 0.60–1.35)
GFR, Est African American: 98 mL/min/{1.73_m2} (ref >=60)
GFR, Est Non African American: 84 mL/min/{1.73_m2} (ref >=60)
Glucose, Bld: 446 mg/dL — ABNORMAL HIGH (ref 65–99)
Potassium: 4.4 mmol/L (ref 3.5–5.3)
Sodium: 135 mmol/L (ref 135–146)

## 2020-08-06 LAB — CBC WITH DIFFERENTIAL/PLATELET
Absolute Monocytes: 332 cells/uL (ref 200–950)
Basophils Absolute: 51 cells/uL (ref 0–200)
Basophils Relative: 1 %
Eosinophils Absolute: 71 cells/uL (ref 15–500)
Eosinophils Relative: 1.4 %
HCT: 46.2 % (ref 38.5–50.0)
Hemoglobin: 15.5 g/dL (ref 13.2–17.1)
Lymphs Abs: 2025 cells/uL (ref 850–3900)
MCH: 29.4 pg (ref 27.0–33.0)
MCHC: 33.5 g/dL (ref 32.0–36.0)
MCV: 87.7 fL (ref 80.0–100.0)
MPV: 10.2 fL (ref 7.5–12.5)
Monocytes Relative: 6.5 %
Neutro Abs: 2621 cells/uL (ref 1500–7800)
Neutrophils Relative %: 51.4 %
Platelets: 204 10*3/uL (ref 140–400)
RBC: 5.27 10*6/uL (ref 4.20–5.80)
RDW: 12 % (ref 11.0–15.0)
Total Lymphocyte: 39.7 %
WBC: 5.1 10*3/uL (ref 3.8–10.8)

## 2020-08-06 LAB — TSH: TSH: 1.5 mIU/L (ref 0.40–4.50)

## 2020-08-06 LAB — MICROALBUMIN / CREATININE URINE RATIO
Creatinine, Urine: 63 mg/dL (ref 20–320)
Microalb Creat Ratio: 33 mcg/mg creat — ABNORMAL HIGH (ref <30)
Microalb, Ur: 2.1 mg/dL

## 2020-08-06 LAB — C-PEPTIDE: C-Peptide: 1.22 ng/mL (ref 0.80–3.85)

## 2020-08-06 LAB — URINALYSIS, ROUTINE W REFLEX MICROSCOPIC
Bilirubin Urine: NEGATIVE
Hgb urine dipstick: NEGATIVE
Ketones, ur: NEGATIVE
Leukocytes,Ua: NEGATIVE
Nitrite: NEGATIVE
Protein, ur: NEGATIVE
Specific Gravity, Urine: 1.035 (ref 1.001–1.03)
pH: 6 (ref 5.0–8.0)

## 2020-08-06 LAB — HEPATIC FUNCTION PANEL
AG Ratio: 1.5 (calc) (ref 1.0–2.5)
ALT: 11 U/L (ref 9–46)
AST: 12 U/L (ref 10–40)
Albumin: 4.3 g/dL (ref 3.6–5.1)
Alkaline phosphatase (APISO): 101 U/L (ref 36–130)
Bilirubin, Direct: 0.1 mg/dL (ref 0.0–0.2)
Globulin: 2.9 g/dL (calc) (ref 1.9–3.7)
Indirect Bilirubin: 0.4 mg/dL (calc) (ref 0.2–1.2)
Total Bilirubin: 0.5 mg/dL (ref 0.2–1.2)
Total Protein: 7.2 g/dL (ref 6.1–8.1)

## 2020-08-06 LAB — LIPID PANEL
Cholesterol: 152 mg/dL (ref <200)
HDL: 35 mg/dL — ABNORMAL LOW (ref >=40)
LDL Cholesterol (Calc): 97 mg/dL (calc)
Non-HDL Cholesterol (Calc): 117 mg/dL (calc) (ref <130)
Total CHOL/HDL Ratio: 4.3 (calc) (ref <5.0)
Triglycerides: 104 mg/dL (ref <150)

## 2020-08-06 LAB — PSA: PSA: 1.5 ng/mL (ref <=4.0)

## 2020-08-06 MED ORDER — HUMALOG KWIKPEN 200 UNIT/ML ~~LOC~~ SOPN: 10.0000 [IU] | PEN_INJECTOR | Freq: Three times a day (TID) | SUBCUTANEOUS | 1 refills | Status: DC

## 2020-08-06 MED ORDER — ROSUVASTATIN CALCIUM 5 MG PO TABS: 5.0000 mg | ORAL_TABLET | Freq: Every day | ORAL | 1 refills | Status: DC

## 2020-08-06 MED ORDER — ASPIRIN EC 81 MG PO TBEC: 81.0000 mg | DELAYED_RELEASE_TABLET | Freq: Every day | ORAL | 1 refills | Status: DC

## 2020-08-07 MED ORDER — NOVOFINE 32G X 6 MM MISC: 1.0000 | Freq: Four times a day (QID) | 5 refills | Status: DC

## 2020-08-08 ENCOUNTER — Encounter: Payer: Self-pay | Admitting: Internal Medicine

## 2020-08-11 ENCOUNTER — Encounter: Payer: Self-pay | Admitting: Internal Medicine

## 2020-08-22 ENCOUNTER — Encounter: Payer: Self-pay | Admitting: Internal Medicine

## 2020-08-24 ENCOUNTER — Other Ambulatory Visit: Payer: Self-pay | Admitting: Internal Medicine

## 2020-08-24 DIAGNOSIS — E118 Type 2 diabetes mellitus with unspecified complications: Secondary | ICD-10-CM

## 2020-08-24 MED ORDER — BASAGLAR KWIKPEN 100 UNIT/ML ~~LOC~~ SOPN: 50.0000 [IU] | PEN_INJECTOR | Freq: Every day | SUBCUTANEOUS | 0 refills | Status: DC

## 2020-08-25 LAB — HM DIABETES EYE EXAM

## 2020-08-26 ENCOUNTER — Encounter: Payer: Self-pay | Admitting: Internal Medicine

## 2020-08-26 NOTE — Progress Notes (Signed)
error 

## 2020-09-06 LAB — HM DIABETES EYE EXAM

## 2020-09-08 ENCOUNTER — Other Ambulatory Visit: Payer: Self-pay

## 2020-09-08 ENCOUNTER — Ambulatory Visit (INDEPENDENT_AMBULATORY_CARE_PROVIDER_SITE_OTHER): Payer: Managed Care, Other (non HMO) | Admitting: Internal Medicine

## 2020-09-08 ENCOUNTER — Encounter: Payer: Self-pay | Admitting: Internal Medicine

## 2020-09-08 VITALS — BP 112/80 | HR 96 | Ht 72.0 in | Wt 207.2 lb

## 2020-09-08 DIAGNOSIS — E1129 Type 2 diabetes mellitus with other diabetic kidney complication: Secondary | ICD-10-CM

## 2020-09-08 DIAGNOSIS — E785 Hyperlipidemia, unspecified: Secondary | ICD-10-CM | POA: Diagnosis not present

## 2020-09-08 DIAGNOSIS — E1165 Type 2 diabetes mellitus with hyperglycemia: Secondary | ICD-10-CM

## 2020-09-08 DIAGNOSIS — Z794 Long term (current) use of insulin: Secondary | ICD-10-CM | POA: Diagnosis not present

## 2020-09-08 DIAGNOSIS — R809 Proteinuria, unspecified: Secondary | ICD-10-CM

## 2020-09-08 LAB — POCT GLUCOSE (DEVICE FOR HOME USE): POC Glucose: 167 mg/dl — AB (ref 70–99)

## 2020-09-08 MED ORDER — METFORMIN HCL 500 MG PO TABS: 1000.0000 mg | ORAL_TABLET | Freq: Two times a day (BID) | ORAL | 3 refills | Status: DC

## 2020-09-08 MED ORDER — HUMALOG KWIKPEN 200 UNIT/ML ~~LOC~~ SOPN: 10.0000 [IU] | PEN_INJECTOR | Freq: Three times a day (TID) | SUBCUTANEOUS | 6 refills | Status: DC

## 2020-09-08 MED ORDER — BASAGLAR KWIKPEN 100 UNIT/ML ~~LOC~~ SOPN: 45.0000 [IU] | PEN_INJECTOR | Freq: Every day | SUBCUTANEOUS | 6 refills | Status: DC

## 2020-09-08 NOTE — Progress Notes (Signed)
Name: Robert [name]  MRN/ DOB: [id number], [birth year]   Age/ Sex: 39 y.o., male    PCP: Janith Lima, MD   Reason for Endocrinology Evaluation: Type 2 Diabetes Mellitus     Date of Initial Endocrinology Visit: 09/08/2020     PATIENT IDENTIFIER: [name] is a 39 y.o. male with a past medical history of OSA, T2DM and GERD. The patient presented for initial endocrinology clinic visit on 09/08/2020 for consultative assistance with his diabetes management.    HPI: [name] was    Diagnosed with DM in 2015 Prior Medications tried/Intolerance: has been trying to manage with diet but was started on insulin in 08/2020 Currently checking blood sugars occasionally   Hypoglycemia episodes : no                Hemoglobin A1c has ranged from 7.3% in 2019, peaking at 13.3% in 2021. 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 No pertinent surgical history.   Social History:  reports that he has never smoked. He has never used smokeless tobacco. He reports that he does not drink alcohol and does not use drugs. Family History:  Family History  Problem Relation Age of Onset   Hypertension Other    Asthma  Son    Cancer Neg Hx    Diabetes Neg Hx    Early death Neg Hx    Heart disease Neg Hx    Hyperlipidemia Neg Hx    Kidney disease Neg Hx    Stroke Neg Hx      HOME MEDICATIONS: Allergies as of 09/08/2020   No Known Allergies     Medication List       Accurate as of September 08, 2020 10:31 AM. If you have any questions, ask your nurse or doctor.        STOP taking these medications   meloxicam 15 MG tablet Commonly known as: MOBIC Stopped by: Dorita Sciara, MD   tizanidine 2 MG capsule Commonly known as: Zanaflex Stopped by: Dorita Sciara, MD     TAKE these medications   Accu-Chek Guide Me w/Device Kit 1 Act by Does not apply route in the morning, at noon, and at bedtime.   aspirin EC 81 MG tablet Take 1 tablet (81 mg total) by mouth daily.   Basaglar KwikPen 100 UNIT/ML Inject 0.5 mLs (50 Units total) into the skin daily.   FreeStyle Libre 14 Day Reader Kerrin Mo 1 Act by Does not apply route daily.   FreeStyle Libre 14 Day Sensor Misc 1 Act by Does not apply route daily.  HumaLOG KwikPen 200 UNIT/ML KwikPen Generic drug: insulin lispro Inject 10 Units into the skin with breakfast, with lunch, and with evening meal.   NovoFine 32G X 6 MM Misc Generic drug: Insulin Pen Needle 1 Act by Does not apply route in the morning, at noon, in the evening, and at bedtime.   rosuvastatin 5 MG tablet Commonly known as: CRESTOR Take 1 tablet (5 mg total) by mouth daily.        ALLERGIES: No Known Allergies   REVIEW OF SYSTEMS: A comprehensive ROS was conducted with the patient and is negative except as per HPI and below:  ROS    OBJECTIVE:   VITAL SIGNS: BP 112/80 (BP Location: Left Arm, Patient Position: Sitting, Cuff Size: Normal)    Pulse 96    Ht 6' (1.829 m)    Wt 207 lb 3.2 oz (94 kg)    SpO2 99%    BMI 28.10 kg/m    PHYSICAL EXAM:  General: Pt appears well and is in NAD  Neck: General: Supple without adenopathy or carotid  bruits. Thyroid: Thyroid size normal.  No goiter or nodules appreciated.   Lungs: Clear with good BS bilat with no rales, rhonchi, or wheezes  Heart: RRR with normal S1 and S2 and no gallops; no murmurs; no rub  Abdomen: Normoactive bowel sounds, soft, nontender, without masses or organomegaly palpable  Extremities:  Lower extremities - No pretibial edema. No lesions.  Skin: Normal texture and temperature to palpation.   Neuro: MS is good with appropriate affect, pt is alert and Ox3    DM foot exam: 09/08/2020   The skin of the feet is intact without sores or ulcerations. The pedal pulses are 2+ on right and 2+ on left. The sensation is intact to a screening 5.07, 10 gram monofilament bilaterally    DATA REVIEWED:  Lab Results  Component Value Date   HGBA1C 13.3 (A) 08/05/2020   HGBA1C 7.3 (A) 06/12/2018   HGBA1C 7.6 (H) 12/12/2017   Lab Results  Component Value Date   MICROALBUR 2.1 08/05/2020   LDLCALC 97 08/05/2020   CREATININE 1.09 08/05/2020   Lab Results  Component Value Date   MICRALBCREAT 33 (H) 08/05/2020    Lab Results  Component Value Date   CHOL 152 08/05/2020   HDL 35 (L) 08/05/2020   LDLCALC 97 08/05/2020   TRIG 104 08/05/2020   CHOLHDL 4.3 08/05/2020        ASSESSMENT / PLAN / RECOMMENDATIONS:   1) Type 2 Diabetes Mellitus, Poorly controlled, With microalbuminuria  complications - Most recent A1c of 13.3 %. Goal A1c < 7.0 %.   Plan: GENERAL: I have discussed with the patient the pathophysiology of diabetes. We went over the natural progression of the disease. We talked about both insulin resistance and insulin deficiency. We stressed the importance of lifestyle changes including diet and exercise. I explained the complications associated with diabetes including retinopathy, nephropathy, neuropathy as well as increased risk of cardiovascular disease. We went over the benefit seen with glycemic control.    I explained to the patient that diabetic  patients are at higher than normal risk for amputations.  We discussed the importance of low carb diet and avoiding sugar-sweetened beverages. He has an appointment to see an RD  Discussed pharmacokinetics of basal/bolus insulin and the importance of taking prandial insulin with meals.   We also discussed avoiding sugar-sweetened beverages and snacks, when possible.   Discussed Add-on therapy , discussed GI side effects  of metformin, will titrate up gradually   MEDICATIONS: - Decrease Basaglar to 45 units daily  - Take 6 units with Breakfast and 8 units with Lunch and supper  -Start Metformin 500 mg XR 2 tabs twice daily-titration provided  EDUCATION / INSTRUCTIONS:  BG monitoring instructions: Patient is instructed to check his blood sugars 3 times a day, before meals .  Call [facility] if: BG persistently < 70  I reviewed the Rule of 15 for the treatment of hypoglycemia in detail with the patient. Literature supplied.   2) Diabetic complications:   Eye: Does not have known diabetic retinopathy.   Neuro/ Feet: Does not have known diabetic peripheral neuropathy.  Renal: Patient does not have known baseline CKD. He is not on an ACEI/ARB at present.   3) Dyslipidemia : Patient is on rosuvastatin 5 mg daily which was started recently. . LDL borderline. Discussed cardiovascular benefits     Follow-up in 3 months   Signed electronically by: Mack Guise, MD  [facility]  [facility] [address] [place], [place] [address] Phone: [phone] FAX: [phone]   CC: Janith Lima, MD [place] [address] Phone: [phone]  Fax: [phone]    Return to Endocrinology clinic as below: Future Appointments  Date Time Provider [place]  09/20/2020 10:40 AM Janith Lima, MD [facility] None  09/24/2020 11:00 AM Fredia Sorrow, RD [place] [facility]

## 2020-09-08 NOTE — Patient Instructions (Signed)
-   Decrease Basaglar to 45 units daily  - Take 6 units with Breakfast and 8 units with Lunch and supper  - Start Metformin 1 tablet daily with Breakfast for 1 week, then increase to 1 tablet with Breakfast and 1 tablet with Supper for  1 week, then increase to 2 tablets with Breakfast  and 1 tablet with Supper for another 1 week , then finally 2 Tablets with Breakfast and 2 tablets with Supper.    - Fasting goal less then 140 mg/dL  - During the day goal is less then 180 mg/dL       HOW TO TREAT LOW BLOOD SUGARS (Blood sugar LESS THAN 70 MG/DL)  Please follow the RULE OF 15 for the treatment of hypoglycemia treatment (when your (blood sugars are less than 70 mg/dL)    STEP 1: Take 15 grams of carbohydrates when your blood sugar is low, which includes:   3-4 GLUCOSE TABS  OR  3-4 OZ OF JUICE OR REGULAR SODA OR  ONE TUBE OF GLUCOSE GEL     STEP 2: RECHECK blood sugar in 15 MINUTES STEP 3: If your blood sugar is still low at the 15 minute recheck --> then, go back to STEP 1 and treat AGAIN with another 15 grams of carbohydrates.

## 2020-09-09 DIAGNOSIS — E785 Hyperlipidemia, unspecified: Secondary | ICD-10-CM | POA: Insufficient documentation

## 2020-09-09 DIAGNOSIS — Z794 Long term (current) use of insulin: Secondary | ICD-10-CM | POA: Insufficient documentation

## 2020-09-09 DIAGNOSIS — E1129 Type 2 diabetes mellitus with other diabetic kidney complication: Secondary | ICD-10-CM | POA: Insufficient documentation

## 2020-09-20 ENCOUNTER — Ambulatory Visit (INDEPENDENT_AMBULATORY_CARE_PROVIDER_SITE_OTHER): Payer: Managed Care, Other (non HMO) | Admitting: Internal Medicine

## 2020-09-20 ENCOUNTER — Other Ambulatory Visit: Payer: Self-pay

## 2020-09-20 ENCOUNTER — Encounter: Payer: Self-pay | Admitting: Internal Medicine

## 2020-09-20 VITALS — BP 122/70 | HR 83 | Temp 99.1°F | Ht 72.0 in | Wt 207.0 lb

## 2020-09-20 DIAGNOSIS — R809 Proteinuria, unspecified: Secondary | ICD-10-CM

## 2020-09-20 DIAGNOSIS — Z23 Encounter for immunization: Secondary | ICD-10-CM

## 2020-09-20 DIAGNOSIS — E1129 Type 2 diabetes mellitus with other diabetic kidney complication: Secondary | ICD-10-CM

## 2020-09-20 DIAGNOSIS — Z794 Long term (current) use of insulin: Secondary | ICD-10-CM | POA: Diagnosis not present

## 2020-09-20 NOTE — Progress Notes (Signed)
Subjective:  Patient ID: Robert [name], male    DOB: 08/30/1981  Age: 39 y.o. MRN: [id number]  CC: Diabetes  This visit occurred during the SARS-CoV-2 public health emergency.  Safety protocols were in place, including screening questions prior to the visit, additional usage of staff PPE, and extensive cleaning of exam room while observing appropriate contact time as indicated for disinfecting solutions.    HPI Robert [name] presents for f/up - He is using a continuous glucose monitor and tells me for the most part his blood sugars are less than 140.  He has a rare spike up to 200 and usually this is during the night.  He denies dizziness, lightheadedness, or polys.  Outpatient Medications Prior to Visit  Medication Sig Dispense Refill   aspirin EC 81 MG tablet Take 1 tablet (81 mg total) by mouth daily. 90 tablet 1   Blood Glucose Monitoring Suppl (ACCU-CHEK GUIDE ME) w/Device KIT 1 Act by Does not apply route in the morning, at noon, and at bedtime. 1 kit 0   Continuous Blood Gluc Receiver (FREESTYLE LIBRE 14 DAY READER) DEVI 1 Act by Does not apply route daily. 2 each 5   Continuous Blood Gluc Sensor (FREESTYLE LIBRE 14 DAY SENSOR) MISC 1 Act by Does not apply route daily. 2 each 5   Insulin Glargine (BASAGLAR KWIKPEN) 100 UNIT/ML Inject 45 Units into the skin daily. 15 mL 6   insulin lispro (HUMALOG KWIKPEN) 200 UNIT/ML KwikPen Inject 10 Units into the skin with breakfast, with lunch, and with evening meal. 15 mL 6   Insulin Pen Needle (NOVOFINE) 32G X 6 MM MISC 1 Act by Does not apply route in the morning, at noon, in the evening, and at bedtime. 100 each 5   metFORMIN (GLUCOPHAGE) 500 MG tablet Take 2 tablets (1,000 mg total) by mouth 2 (two) times daily with a meal. 360 tablet 3   rosuvastatin (CRESTOR) 5 MG tablet Take 1 tablet (5 mg total) by mouth daily. 90 tablet 1   No facility-administered medications prior to visit.    ROS Review of Systems  Constitutional:  Negative for appetite change, diaphoresis, fatigue and unexpected weight change.  HENT: Negative.   Eyes: Negative for visual disturbance.  Respiratory: Negative for cough, chest tightness, shortness of breath and wheezing.   Cardiovascular: Negative for chest pain, palpitations and leg swelling.  Gastrointestinal: Negative for abdominal pain, constipation, diarrhea, nausea and vomiting.  Endocrine: Negative.  Negative for polydipsia, polyphagia and polyuria.  Genitourinary: Negative for difficulty urinating.  Musculoskeletal: Negative for arthralgias and myalgias.  Skin: Negative.   Neurological: Negative.  Negative for dizziness and weakness.  Hematological: Negative for adenopathy. Does not bruise/bleed easily.  Psychiatric/Behavioral: Negative.     Objective:  BP 122/70    Pulse 83    Temp 99.1 F (37.3 C) (Oral)    Ht 6' (1.829 m)    Wt 207 lb (93.9 kg)    SpO2 97%    BMI 28.07 kg/m   BP Readings from Last 3 Encounters:  09/20/20 122/70  09/08/20 112/80  08/05/20 110/80    Wt Readings from Last 3 Encounters:  09/20/20 207 lb (93.9 kg)  09/08/20 207 lb 3.2 oz (94 kg)  08/05/20 205 lb (93 kg)    Physical Exam Vitals reviewed.  HENT:     Nose: Nose normal.     Mouth/Throat:     Mouth: Mucous membranes are moist.  Eyes:     General: No scleral  icterus.    Conjunctiva/sclera: Conjunctivae normal.  Cardiovascular:     Rate and Rhythm: Normal rate and regular rhythm.     Heart sounds: No murmur heard.   Pulmonary:     Effort: Pulmonary effort is normal.     Breath sounds: No stridor. No wheezing, rhonchi or rales.  Abdominal:     General: Abdomen is flat.     Palpations: There is no mass.     Tenderness: There is no abdominal tenderness.  Musculoskeletal:        General: Normal range of motion.     Cervical back: Neck supple.     Right lower leg: No edema.     Left lower leg: No edema.  Lymphadenopathy:     Cervical: No cervical adenopathy.  Skin:    General:  Skin is warm and dry.     Coloration: Skin is not pale.  Neurological:     General: No focal deficit present.     Mental Status: He is alert.  Psychiatric:        Mood and Affect: Mood normal.        Behavior: Behavior normal.     Lab Results  Component Value Date   WBC 5.1 08/05/2020   HGB 15.5 08/05/2020   HCT 46.2 08/05/2020   PLT 204 08/05/2020   GLUCOSE 446 (H) 08/05/2020   CHOL 152 08/05/2020   TRIG 104 08/05/2020   HDL 35 (L) 08/05/2020   LDLCALC 97 08/05/2020   ALT 11 08/05/2020   AST 12 08/05/2020   NA 135 08/05/2020   K 4.4 08/05/2020   CL 100 08/05/2020   CREATININE 1.09 08/05/2020   BUN 17 08/05/2020   CO2 27 08/05/2020   TSH 1.50 08/05/2020   PSA 1.5 08/05/2020   HGBA1C 13.3 (A) 08/05/2020   MICROALBUR 2.1 08/05/2020    No results found.  Assessment & Plan:   Jesstin was seen today for diabetes.  Diagnoses and all orders for this visit:  Type 2 diabetes mellitus with microalbuminuria, with long-term current use of insulin ([place])- Improvement noted.  Will continue the combination of Metformin and basal/bolus insulin. -     HM Diabetes Foot Exam  Other orders -     Flu Vaccine QUAD 6+ mos PF IM (Fluarix Quad PF) -     Pneumococcal polysaccharide vaccine 23-valent greater than or equal to 2yo subcutaneous/IM   I am having Coral Else maintain his Accu-Chek Guide Me, FreeStyle Libre 14 Day Reader, FreeStyle Libre 14 Day Sensor, rosuvastatin, aspirin EC, NovoFine, metFORMIN, HumaLOG KwikPen, and Basaglar KwikPen.  No orders of the defined types were placed in this encounter.    Follow-up: Return in about 6 months (around 03/21/2021).  Scarlette Calico, MD

## 2020-09-20 NOTE — Patient Instructions (Signed)
Type 1 Diabetes Mellitus, Diagnosis, Adult  Type 1 diabetes (type 1 diabetes mellitus) is a long-term (chronic) disease. It occurs when the pancreas does not make enough of a hormone called insulin. Normally, insulin allows sugars (glucose) to enter cells in the body. The cells use glucose for energy. Lack of insulin causes excess glucose to build up in the blood instead of going into cells. As a result, high blood glucose (hyperglycemia) develops. The exact cause of type 1 diabetes is not known. There is currently no cure for type 1 diabetes, but it can be managed with insulin treatment and lifestyle changes. What increases the risk? You may be more likely to develop this condition if you have a family member who has type 1 diabetes. Other factors may also make you more likely to develop type 1 diabetes, such as:  Having a gene for type 1 diabetes that is passed along from parent to child (inherited).  Living in an area with cold weather conditions.  Exposure to certain viruses.  Certain conditions in which the body's disease-fighting (immune) system attacks itself (autoimmune disorders). What are the signs or symptoms? Symptoms may develop gradually over days or weeks, or they may develop suddenly. Symptoms may include:  Increased thirst (polydipsia).  Increased hunger(polyphagia).  Increased urination (polyuria).  Increased urination during the night (nocturia).  Sudden or unexplained weight changes.  Frequent infections that keep coming back (recurring).  Fatigue.  Weakness.  Vision changes, such as blurry vision.  Fruity-smelling breath.  Cuts or bruises that are slow to heal.  Tingling or numbness in the hands or feet. How is this diagnosed? This condition is diagnosed based on your symptoms, your medical history, a physical exam, and your blood glucose level. Your blood glucose may be checked with one or more of the following blood tests:  A fasting blood glucose  (FBG) test. You will not be allowed to eat (you will fast) for 8 hours or longer before a blood sample is taken.  A random blood glucose test. This checks blood glucose at any time of day regardless of when you ate.  An A1c (hemoglobin A1c) blood test. This provides information about blood glucose control over the previous 2-3 months. You may be diagnosed with type 1 diabetes if:  Your FBG level is 126 mg/dL (7.0 mmol/L) or higher.  Your random blood glucose level is 200 mg/dL (11.1 mmol/L) or higher.  Your A1c level is 6.5% or higher. These blood tests may be repeated to confirm your diagnosis. How is this treated? Your treatment may be managed by a specialist called an endocrinologist. Type 1 diabetes can be managed by following instructions from your health care provider about:  Taking insulin daily. This helps to keep your blood glucose levels in the healthy range. ? You may need to adjust your insulin dosage based on how physically active you are and what foods you eat. Your health care provider will tell you how to do this.  Taking medicines to help prevent complications from diabetes, such as: ? Aspirin. ? Medicine to lower cholesterol. ? Medicine to control blood pressure.  Checking your blood glucose as often as directed.  Making diet and lifestyle changes. These may include: ? Following an individualized nutrition plan that is developed by a diet and nutrition specialist (registered dietitian). ? Exercising regularly. ? Finding ways to manage stress. Your health care provider will set individualized treatment goals for you. Your goals will be based on your age, other medical conditions   you have, and how you respond to diabetes treatment. Generally, the goal of treatment is to maintain the following blood glucose levels:  Before meals (preprandial): 80-130 mg/dL (4.4-7.2 mmol/L).  After meals (postprandial): below 180 mg/dL (10 mmol/L).  A1c level: less than 7%. Follow  these instructions at home: Questions to ask your health care provider  Consider asking the following questions: ? Do I need to meet with a diabetes educator? ? Should I consider joining a support group for people with diabetes? ? What equipment will I need to manage my diabetes at home? ? What diabetes medicines should I take, and when? ? How often should I check my blood glucose? ? What number should I call if I have questions? ? When is my next appointment? General instructions  Take over-the-counter and prescription medicines only as told by your health care provider.  Keep all follow-up visits as told by your health care provider. This is important.  For more information about diabetes, visit: ? American Diabetes Association (ADA): www.diabetes.org ? American Association of Diabetes Educators (AADE): www.diabeteseducator.org Contact a health care provider if:  Your blood glucose level is higher than 240 mg/dL (13.3 mmol/L) for 2 days in a row.  You have been sick or have had a fever for 2 days or more and you are not getting better.  You have any of the following problems for more than 6 hours: ? You cannot eat or drink. ? You have nausea and vomiting. ? You have diarrhea. Get help right away if:  Your blood glucose is below 54 mg/dL (3 mmol/L).  You become confused or you have trouble thinking clearly.  You have difficulty breathing.  You have moderate or large ketone levels in your urine. Summary  Type 1 diabetes (type 1 diabetes mellitus) is a long-term (chronic) disease. It occurs when the pancreas does not make enough of a hormone called insulin.  This condition is treated by taking insulin and other medicines to help prevent complications from diabetes. Diet and lifestyle changes are also part of treatment.  Your health care provider will set individualized treatment goals for you. Your goals will be based on your age, other medical conditions you have, and how  you respond to diabetes treatment. This information is not intended to replace advice given to you by your health care provider. Make sure you discuss any questions you have with your health care provider. Document Revised: 11/16/2017 Document Reviewed: 01/07/2016 Elsevier Patient Education  2020 Else[employer] Inc.  

## 2020-09-24 ENCOUNTER — Encounter: Payer: Managed Care, Other (non HMO) | Attending: Internal Medicine | Admitting: Dietician

## 2020-09-29 ENCOUNTER — Encounter: Payer: Self-pay | Admitting: Internal Medicine

## 2020-09-30 ENCOUNTER — Other Ambulatory Visit: Payer: Self-pay | Admitting: Internal Medicine

## 2020-09-30 DIAGNOSIS — E118 Type 2 diabetes mellitus with unspecified complications: Secondary | ICD-10-CM

## 2020-09-30 MED ORDER — FREESTYLE LIBRE 14 DAY READER DEVI: 1.0000 | Freq: Every day | 5 refills | Status: DC

## 2020-09-30 MED ORDER — FREESTYLE LIBRE 14 DAY SENSOR MISC: 1.0000 | Freq: Every day | 5 refills | Status: DC

## 2020-09-30 MED ORDER — GVOKE HYPOPEN 2-PACK 1 MG/0.2ML ~~LOC~~ SOAJ: 1.0000 | Freq: Every day | SUBCUTANEOUS | 5 refills | Status: DC | PRN

## 2020-09-30 MED ORDER — INSULIN PEN NEEDLE 32G X 6 MM MISC: 1.0000 | Freq: Every day | 1 refills | Status: DC

## 2020-10-01 ENCOUNTER — Telehealth: Payer: Self-pay

## 2020-10-01 NOTE — Telephone Encounter (Signed)
Key: [id number]

## 2020-10-08 ENCOUNTER — Ambulatory Visit: Payer: Managed Care, Other (non HMO) | Admitting: Internal Medicine

## 2020-10-28 ENCOUNTER — Other Ambulatory Visit: Payer: Self-pay

## 2020-10-28 ENCOUNTER — Encounter: Payer: Managed Care, Other (non HMO) | Attending: Internal Medicine | Admitting: Dietician

## 2020-10-28 ENCOUNTER — Encounter: Payer: Self-pay | Admitting: Dietician

## 2020-10-28 DIAGNOSIS — E118 Type 2 diabetes mellitus with unspecified complications: Secondary | ICD-10-CM | POA: Insufficient documentation

## 2020-10-28 NOTE — Progress Notes (Signed)
Diabetes Self-Management Education  Visit Type: First/Initial  Appt. Start Time: 1515 Appt. End Time: 1630  10/28/2020  [name], identified by name and date of birth, is a 39 y.o. male with a diagnosis of Diabetes: Type 2.   ASSESSMENT  Pt is concerned with the amount of medication he is on.  Pt is highly motivated to manage diabetes. Reports receiving a lot of information about how to manage diabetes online, and is unsure what to do. Pt lives at home with his son and daughter. Works throughout the day, and sometimes find it difficult to eat regularly. Pt reports BG between 90-160 throughout the day. Pt has [employer]. Pt reports waking up feeling weird twice in two months, and reported BG readings of around 60. Reports he drank OJ to bring it back up. Pt reports just starting working out again. Curious about how physical activity and supplements may affect blood sugar levels.   There were no vitals taken for this visit. There is no height or weight on file to calculate BMI.   Diabetes Self-Management Education - 10/28/20 1616      Visit Information   Visit Type First/Initial      Initial Visit   Diabetes Type Type 2    Are you currently following a meal plan? No    Are you taking your medications as prescribed? Yes    Date Diagnosed Pt unable to remember, chart shows history of T2D up to 5 years prior (2016)      Health Coping   How would you rate your overall health? Good      Psychosocial Assessment   Patient Belief/Attitude about Diabetes Motivated to manage diabetes    Self-care barriers None    Self-management support Doctor's office;Family    Other persons present Patient    Patient Concerns Nutrition/Meal planning;Medication;Glycemic Control;Healthy Lifestyle    Special Needs None    Preferred Learning Style No preference indicated    Learning Readiness Ready    How often do you need to have someone help you when you read instructions, pamphlets, or  other written materials from your doctor or pharmacy? 1 - Never    What is the last grade level you completed in school? Some college      Pre-Education Assessment   Patient understands the diabetes disease and treatment process. Needs Instruction    Patient understands incorporating nutritional management into lifestyle. Needs Instruction    Patient undertands incorporating physical activity into lifestyle. Needs Instruction    Patient understands using medications safely. Needs Instruction    Patient understands monitoring blood glucose, interpreting and using results Needs Instruction    Patient understands prevention, detection, and treatment of acute complications. Needs Instruction    Patient understands prevention, detection, and treatment of chronic complications. Needs Instruction    Patient understands how to develop strategies to address psychosocial issues. Needs Instruction    Patient understands how to develop strategies to promote health/change behavior. Needs Instruction      Complications   Last HgB A1C per patient/outside source 13.3 %   08/05/2020   How often do you check your blood sugar? > 4 times/day   Pt has CGM   Fasting Blood glucose range (mg/dL) [phone]    Postprandial Blood glucose range (mg/dL) 073-710    Number of hypoglycemic episodes per month 1    Can you tell when your blood sugar is low? Yes    What do you do if your blood sugar is  low? Drink small glass of OJ    Number of hyperglycemic episodes per week 0    Have you had a dilated eye exam in the past 12 months? Yes    Have you had a dental exam in the past 12 months? Yes    Are you checking your feet? Yes    How many days per week are you checking your feet? 7      Exercise   Exercise Type ADL's;Moderate (swimming / aerobic walking)    How many days per week to you exercise? 4    How many minutes per day do you exercise? 60    Total minutes per week of exercise 240      Patient Education    Previous Diabetes Education No    Disease state  Definition of diabetes, type 1 and 2, and the diagnosis of diabetes;Factors that contribute to the development of diabetes;Explored patient's options for treatment of their diabetes    Nutrition management  Role of diet in the treatment of diabetes and the relationship between the three main macronutrients and blood glucose level;Food label reading, portion sizes and measuring food.;Carbohydrate counting;Meal timing in regards to the patients' current diabetes medication.    Physical activity and exercise  Role of exercise on diabetes management, blood pressure control and cardiac health.;Identified with patient nutritional and/or medication changes necessary with exercise.    Medications Reviewed patients medication for diabetes, action, purpose, timing of dose and side effects.    Monitoring Taught/discussed recording of test results and interpretation of SMBG.    Acute complications Taught treatment of hypoglycemia - the 15 rule.;Trained/discussed glucagon administration to patient and designated other.    Chronic complications Relationship between chronic complications and blood glucose control;Lipid levels, blood glucose control and heart disease    Psychosocial adjustment Role of stress on diabetes;Identified and addressed patients feelings and concerns about diabetes      Individualized Goals (developed by patient)   Nutrition Follow meal plan discussed;Adjust meds/carbs with exercise as discussed    Physical Activity Exercise 3-5 times per week    Monitoring  Other (comment)   Pay attention to how dietary choices affect your CGM pattern     Post-Education Assessment   Patient understands the diabetes disease and treatment process. Needs Review    Patient understands incorporating nutritional management into lifestyle. Needs Review    Patient undertands incorporating physical activity into lifestyle. Needs Review    Patient understands using  medications safely. Needs Review    Patient understands monitoring blood glucose, interpreting and using results Needs Review    Patient understands prevention, detection, and treatment of acute complications. Needs Review    Patient understands prevention, detection, and treatment of chronic complications. Needs Review    Patient understands how to develop strategies to address psychosocial issues. Needs Review    Patient understands how to develop strategies to promote health/change behavior. Needs Review      Outcomes   Expected Outcomes Demonstrated interest in learning. Expect positive outcomes    Future DMSE 4-6 wks    Program Status Not Completed           Individualized Plan for Diabetes Self-Management Training:   Learning Objective:  Patient will have a greater understanding of diabetes self-management. Patient education plan is to attend individual and/or group sessions per assessed needs and concerns.   Plan:   Patient Instructions  Start your physical activity regiment, but keep an eye on your blood sugar when exercising  to avoid a low blood sugar. If sugar gets to 70 or below, have 15g of fast acting carbs (juice, soda, candy, glucose tabs).   Have 4 carb choices (60g) at each meal. Build your meal as follows: Choose your carb/starch, think about how much you can have to eat 60g of carbs Pair it with protein ~4 oz Fill the rest of your plate with non starchy vegetables (Eat as many as you want)  Eat three meals a day, 5-6 hours a part.     Expected Outcomes:  Demonstrated interest in learning. Expect positive outcomes  Education material provided: ADA - How to Thrive: A Guide for Your Journey with Diabetes, Meal plan card and My Plate  If problems or questions, patient to contact team via:  Phone and Email  Future DSME appointment: 4-6 wks

## 2020-10-28 NOTE — Patient Instructions (Addendum)
Start your physical activity regiment, but keep an eye on your blood sugar when exercising to avoid a low blood sugar. If sugar gets to 70 or below, have 15g of fast acting carbs (juice, soda, candy, glucose tabs).   Have 4 carb choices (60g) at each meal. Build your meal as follows: Choose your carb/starch, think about how much you can have to eat 60g of carbs Pair it with protein ~4 oz Fill the rest of your plate with non starchy vegetables (Eat as many as you want)  Eat three meals a day, 5-6 hours a part.

## 2020-11-08 ENCOUNTER — Encounter: Payer: Self-pay | Admitting: Internal Medicine

## 2020-11-17 ENCOUNTER — Encounter: Payer: Self-pay | Admitting: Internal Medicine

## 2020-11-24 ENCOUNTER — Other Ambulatory Visit: Payer: Self-pay | Admitting: Internal Medicine

## 2020-11-24 DIAGNOSIS — Z794 Long term (current) use of insulin: Secondary | ICD-10-CM

## 2020-11-24 DIAGNOSIS — E1165 Type 2 diabetes mellitus with hyperglycemia: Secondary | ICD-10-CM

## 2020-11-24 MED ORDER — METFORMIN HCL 500 MG PO TABS: 1000.0000 mg | ORAL_TABLET | Freq: Two times a day (BID) | ORAL | 1 refills | Status: DC

## 2020-11-24 MED ORDER — HUMALOG KWIKPEN 200 UNIT/ML ~~LOC~~ SOPN: 10.0000 [IU] | PEN_INJECTOR | Freq: Three times a day (TID) | SUBCUTANEOUS | 1 refills | Status: DC

## 2020-11-29 ENCOUNTER — Other Ambulatory Visit: Payer: Self-pay | Admitting: Internal Medicine

## 2020-11-29 DIAGNOSIS — E785 Hyperlipidemia, unspecified: Secondary | ICD-10-CM

## 2020-11-29 DIAGNOSIS — E1165 Type 2 diabetes mellitus with hyperglycemia: Secondary | ICD-10-CM

## 2020-11-29 DIAGNOSIS — E118 Type 2 diabetes mellitus with unspecified complications: Secondary | ICD-10-CM

## 2020-11-29 DIAGNOSIS — Z794 Long term (current) use of insulin: Secondary | ICD-10-CM

## 2020-11-29 MED ORDER — FREESTYLE LIBRE 14 DAY READER DEVI: 1.0000 | Freq: Every day | 1 refills | Status: DC

## 2020-11-29 MED ORDER — FREESTYLE LIBRE 14 DAY SENSOR MISC: 1.0000 | Freq: Every day | 1 refills | Status: DC

## 2020-11-29 MED ORDER — BASAGLAR KWIKPEN 100 UNIT/ML ~~LOC~~ SOPN: 45.0000 [IU] | PEN_INJECTOR | Freq: Every day | SUBCUTANEOUS | 6 refills | Status: DC

## 2020-11-29 MED ORDER — ASPIRIN EC 81 MG PO TBEC: 81.0000 mg | DELAYED_RELEASE_TABLET | Freq: Every day | ORAL | 1 refills | Status: AC

## 2020-11-29 MED ORDER — ROSUVASTATIN CALCIUM 5 MG PO TABS: 5.0000 mg | ORAL_TABLET | Freq: Every day | ORAL | 1 refills | Status: DC

## 2020-12-09 ENCOUNTER — Ambulatory Visit: Payer: Managed Care, Other (non HMO) | Admitting: Dietician

## 2020-12-20 ENCOUNTER — Encounter: Payer: Self-pay | Admitting: Dietician

## 2020-12-20 ENCOUNTER — Encounter: Payer: Managed Care, Other (non HMO) | Attending: Internal Medicine | Admitting: Dietician

## 2020-12-20 ENCOUNTER — Encounter: Payer: Self-pay | Admitting: Internal Medicine

## 2020-12-20 ENCOUNTER — Other Ambulatory Visit: Payer: Self-pay

## 2020-12-20 ENCOUNTER — Ambulatory Visit (INDEPENDENT_AMBULATORY_CARE_PROVIDER_SITE_OTHER): Payer: Managed Care, Other (non HMO) | Admitting: Internal Medicine

## 2020-12-20 VITALS — BP 126/72 | HR 78 | Ht 72.0 in | Wt 243.2 lb

## 2020-12-20 DIAGNOSIS — E118 Type 2 diabetes mellitus with unspecified complications: Secondary | ICD-10-CM | POA: Diagnosis not present

## 2020-12-20 DIAGNOSIS — Z794 Long term (current) use of insulin: Secondary | ICD-10-CM | POA: Diagnosis not present

## 2020-12-20 DIAGNOSIS — E1165 Type 2 diabetes mellitus with hyperglycemia: Secondary | ICD-10-CM | POA: Diagnosis not present

## 2020-12-20 LAB — POCT GLYCOSYLATED HEMOGLOBIN (HGB A1C): Hemoglobin A1C: 6.8 % — AB (ref 4.0–5.6)

## 2020-12-20 LAB — GLUCOSE, POCT (MANUAL RESULT ENTRY): POC Glucose: 124 mg/dl — AB (ref 70–99)

## 2020-12-20 MED ORDER — DAPAGLIFLOZIN PROPANEDIOL 5 MG PO TABS: 5.0000 mg | ORAL_TABLET | Freq: Every day | ORAL | 1 refills | Status: DC

## 2020-12-20 MED ORDER — BASAGLAR KWIKPEN 100 UNIT/ML ~~LOC~~ SOPN: 36.0000 [IU] | PEN_INJECTOR | Freq: Every day | SUBCUTANEOUS | 3 refills | Status: DC

## 2020-12-20 NOTE — Patient Instructions (Addendum)
-   Decrease Basaglar to 36 units daily  - Continue Metformin 500 mg 2 tablets with breakfast and 2 tablet with Supper - Start Farxiga 5 mg, 1 tablet with Breakfast  -Humalog correctional insulin: Use the scale below to help guide you:   Blood sugar before meal Number of units to inject  Less than 155 0 unit  156 -  180 1 units  181 -  205 2 units  206 -  230 3 units  231 -  255 4 units  256 -  280 5 units  281 -  305 6 units        HOW TO TREAT LOW BLOOD SUGARS (Blood sugar LESS THAN 70 MG/DL)  Please follow the RULE OF 15 for the treatment of hypoglycemia treatment (when your (blood sugars are less than 70 mg/dL)    STEP 1: Take 15 grams of carbohydrates when your blood sugar is low, which includes:   3-4 GLUCOSE TABS  OR  3-4 OZ OF JUICE OR REGULAR SODA OR  ONE TUBE OF GLUCOSE GEL     STEP 2: RECHECK blood sugar in 15 MINUTES STEP 3: If your blood sugar is still low at the 15 minute recheck --> then, go back to STEP 1 and treat AGAIN with another 15 grams of carbohydrates.

## 2020-12-20 NOTE — Progress Notes (Signed)
Name: Robert [name]  Age/ Sex: 40 y.o., male   MRN/ DOB: [id number], [birth year]     PCP: Janith Lima, MD   Reason for Endocrinology Evaluation: Type 2 Diabetes Mellitus  Initial Endocrine Consultative Visit: 09/08/2020    PATIENT IDENTIFIER: Robert [name] is a 40 y.o. male with a past medical history of T2DM, OSA and GERD. The patient has followed with Endocrinology clinic since 09/08/2020 for consultative assistance with management of his diabetes.  DIABETIC HISTORY:  Mr. Rallis was diagnosed with DM in 2015. He was initially on diet modification regimen but insulin was started 08/2020 . His hemoglobin A1c has ranged from 7.3% in 2019, peaking at 13.3% in 2021.   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 We adjusted MDI regimen and started Metformin   SUBJECTIVE:   During the last visit (09/08/2020): A1c 13.3% we adjusted MDI regimen and started Metformin   Today (12/20/2020): Mr. Zorn is here for a follow up on diabetes management.  He checks his blood sugars multiple times daily, through CGM. The patient has  had hypoglycemic episodes since the last clinic visit. The patient is  symptomatic with these episodes. He denies any nausea and vomiting.    HOME DIABETES REGIMEN:  Basaglar 45 units daily  Humalog 6/8/8 units  Metformin 500 mg XR 2 tabs BID     Statin: yes ACE-I/ARB: no     CONTINUOUS GLUCOSE MONITORING RECORD INTERPRETATION    Dates of Recording: 12/21-12/21/2019  Sensor description:freestyle Libre  Results statistics:   CGM use % of time 65  Average and SD 116/32.3  Time in range      87  %  % Time Above 180 6  % Time above 250 0  % Time Below target 6     Glycemic patterns summary: optimal most of the day   Hyperglycemic episodes  N/A  Hypoglycemic episodes occurred during the night and day at time   Overnight periods: trends down       DIABETIC  COMPLICATIONS: Microvascular complications:    Denies: CKD, retinopathy , neuropathy   Last Eye Exam: Completed 08/2020  Macrovascular complications:    Denies: CAD, CVA, PVD   HISTORY:  Past Medical History:  Past Medical History:  Diagnosis Date  . Asthma   . GERD (gastroesophageal reflux disease)     Past Surgical History: No past surgical history on file.  Social History:  reports that he has never smoked. He has never used smokeless tobacco. He reports that he does not drink alcohol and does not use drugs. Family History:  Family History  Problem Relation Age of Onset  . Hypertension Other   . Asthma Son   . Cancer Neg Hx   . Diabetes Neg Hx   . Early death Neg Hx   . Heart disease Neg Hx   . Hyperlipidemia Neg Hx   . Kidney disease Neg Hx   . Stroke Neg Hx      HOME MEDICATIONS: Allergies as of 12/20/2020   No Known Allergies     Medication List       Accurate as of December 20, 2020  8:39 AM. If you have any questions, ask your nurse or doctor.        Accu-Chek Guide Me w/Device Kit 1 Act by Does not apply route in the morning, at  noon, and at bedtime.   aspirin EC 81 MG tablet Take 1 tablet (81 mg total) by mouth daily.   Basaglar KwikPen 100 UNIT/ML Inject 45 Units into the skin daily.   dapagliflozin propanediol 5 MG Tabs tablet Commonly known as: Farxiga Take 1 tablet (5 mg total) by mouth daily. Started by: Dorita Sciara, MD   FreeStyle Libre 14 Day Reader Kerrin Mo 1 Act by Does not apply route daily.   FreeStyle Libre 14 Day Sensor Misc 1 Act by Does not apply route daily.   Gvoke HypoPen 2-Pack 1 MG/0.2ML Soaj Generic drug: Glucagon Inject 1 Act into the skin daily as needed.   HumaLOG KwikPen 200 UNIT/ML KwikPen Generic drug: insulin lispro Inject 10 Units into the skin with breakfast, with lunch, and with evening meal.   Insulin Pen Needle 32G X 6 MM Misc 1 Act by Does not apply route daily.   metFORMIN 500 MG  tablet Commonly known as: GLUCOPHAGE Take 2 tablets (1,000 mg total) by mouth 2 (two) times daily with a meal.   rosuvastatin 5 MG tablet Commonly known as: CRESTOR Take 1 tablet (5 mg total) by mouth daily.        OBJECTIVE:   Vital Signs: BP 126/72   Pulse 78   Ht 6' (1.829 m)   Wt 243 lb 4 oz (110.3 kg)   SpO2 98%   BMI 32.99 kg/m   Wt Readings from Last 3 Encounters:  12/20/20 243 lb 4 oz (110.3 kg)  09/20/20 207 lb (93.9 kg)  09/08/20 207 lb 3.2 oz (94 kg)     Exam: General: Pt appears well and is in NAD  Lungs: Clear with good BS bilat with no rales, rhonchi, or wheezes  Heart: RRR with normal S1 and S2 and no gallops; no murmurs; no rub  Abdomen: Normoactive bowel sounds, soft, nontender, without masses or organomegaly palpable  Extremities: No pretibial edema  Neuro: MS is good with appropriate affect, pt is alert and Ox3      DM foot exam: 09/08/2020   The skin of the feet is intact without sores or ulcerations. The pedal pulses are 2+ on right and 2+ on left. The sensation is intact to a screening 5.07, 10 gram monofilament bilaterally    DATA REVIEWED:  Lab Results  Component Value Date   HGBA1C 6.8 (A) 12/20/2020   HGBA1C 13.3 (A) 08/05/2020   HGBA1C 7.3 (A) 06/12/2018   Lab Results  Component Value Date   MICROALBUR 2.1 08/05/2020   LDLCALC 97 08/05/2020   CREATININE 1.09 08/05/2020   Lab Results  Component Value Date   MICRALBCREAT 33 (H) 08/05/2020     Lab Results  Component Value Date   CHOL 152 08/05/2020   HDL 35 (L) 08/05/2020   LDLCALC 97 08/05/2020   TRIG 104 08/05/2020   CHOLHDL 4.3 08/05/2020         ASSESSMENT / PLAN / RECOMMENDATIONS:    1) Type 2 Diabetes Mellitus, Optimally controlled, With microalbuminuria  complications - Most recent A1c of 6.8 %. Goal A1c < 7.0 %.    - I have congratulated him on optimal glycemic control and encouraged him to continue with lifestyle changes  - We have discussed  reducing basal insulin as below, as well as switching prandial insulin from a standing premeal dosing to correctional factors only.  - We discussed adding SGLt-2 inhibitors, cautioned against genital infection    MEDICATIONS: - Decrease Basaglar to 36 units daily  -  Continue Metformin 500 mg 2 tablets with breakfast and 2 tablet with Supper - Start Farxiga 5 mg, 1 tablet with Breakfast  - Correction Factor : Humalog ( BG -130/25)   EDUCATION / INSTRUCTIONS:  BG monitoring instructions: Patient is instructed to check his blood sugars 3 times a day, before meals   Call [facility] if: BG persistently < 70  . I reviewed the Rule of 15 for the treatment of hypoglycemia in detail with the patient. Literature supplied.    2) Diabetic complications:   Eye: Does not have known diabetic retinopathy.   Neuro/ Feet: Does not have known diabetic peripheral neuropathy.  Renal: Patient does not have known baseline CKD. He is not on an ACEI/ARB at present.   3) Dyslipidemia : Patient is on rosuvastatin 5 mg daily which was started recently . This was started on 07/2020     F/U in 3 months    Signed electronically by: Mack Guise, MD  [facility] Endocrinology  [facility] [address] [place], [place] [address] Phone: [phone] FAX: [phone]   CC: Janith Lima, MD [place] [address] Phone: [phone]  Fax: [phone]  Return to Endocrinology clinic as below: Future Appointments  Date Time Provider [place]  12/22/2020  9:15 AM Fredia Sorrow, RD [place] [facility]  03/25/2021  7:30 AM Dwan Hemmelgarn, Melanie Crazier, MD [facility] None

## 2020-12-20 NOTE — Progress Notes (Signed)
Diabetes Self-Management Education  Visit Type: Follow-up  Appt. Start Time: 0900 Appt. End Time: 0940  12/20/2020  [name], identified by name and date of birth, is a 40 y.o. male with a diagnosis of Diabetes: Type 2.   ASSESSMENT Pt appointment originally scheduled for 12/22/2020 at 9:15 am. Dietitian was able to see pt while pt was on site for visit to [facility].  Pt reporting directly from endocrinologist. A1c is now 6.8, down from 13.3 4 months ago. Endocrinologist has lowered his dosage of basaglar, only indicated humalog if BG is over 150 before a meal, and started pt on Farxiga daily at breakfast. Pt has been informed of increased risk of UTI with [place]. Pt reports still having lows occasionally in the morning, but is not symptomatic like before. Pt still drinks some juice to bring BG up. Pt states he will eat a salad around 2 am, but no carbs. Pt reports being physically active 3-4 days a week for an hour, including resistance training. Pt states that he has been watching his BG pattern on his [place] compared to the food that he eats. States he notices sharp rises in BG when he eats potato chips more than any food. Pt has noticed how his physical activity has lowered his BG as well. FBG range - ~70mg /dL CBG range - 09-811 mg/dL  Height 6\' 4"  (1.93 m), weight 243 lb 9.6 oz (110.5 kg). Body mass index is 29.65 kg/m.   Diabetes Self-Management Education - 12/20/20 0909      Visit Information   Visit Type Follow-up      Initial Visit   Diabetes Type Type 2    Are you currently following a meal plan? Yes   carb counting   Are you taking your medications as prescribed? Yes      Health Coping   How would you rate your overall health? Good      Psychosocial Assessment   Patient Belief/Attitude about Diabetes Motivated to manage diabetes    Self-care barriers None    Learning Readiness Change in progress      Pre-Education Assessment   Patient understands  the diabetes disease and treatment process. Demonstrates understanding / competency    Patient understands incorporating nutritional management into lifestyle. Demonstrates understanding / competency    Patient undertands incorporating physical activity into lifestyle. Demonstrates understanding / competency    Patient understands using medications safely. Demonstrates understanding / competency    Patient understands monitoring blood glucose, interpreting and using results Needs Review    Patient understands prevention, detection, and treatment of acute complications. Demonstrates understanding / competency    Patient understands prevention, detection, and treatment of chronic complications. Needs Review    Patient understands how to develop strategies to address psychosocial issues. Needs Review    Patient understands how to develop strategies to promote health/change behavior. Demonstrates understanding / competency      Complications   Last HgB A1C per patient/outside source 6.8 %   12/20/2020   How often do you check your blood sugar? > 4 times/day   Freestyle Libre   Fasting Blood glucose range (mg/dL) 02/17/2021    Postprandial Blood glucose range (mg/dL) 91-478    Number of hyperglycemic episodes per week 0      Dietary Intake   Breakfast Cup of cheerios with almond milk    Snack (morning) none    Lunch 1/2 banana, broccoli, ham and cheese sandwich    Snack (afternoon) beef jerky and almonds  Dinner Shrimp and broccoli chinese food, a little rice    Snack (evening) none    Beverage(s) water, low calorie body armor      Exercise   Exercise Type ADL's;Moderate (swimming / aerobic walking)    How many days per week to you exercise? 4    How many minutes per day do you exercise? 60    Total minutes per week of exercise 240      Patient Self-Evaluation of Goals - Patient rates self as meeting previously set goals (% of time)   Nutrition 50 - 75 %    Physical Activity >75%     Medications >75%    Monitoring >75%    Problem Solving 50 - 75 %    Reducing Risk >75%    Health Coping 50 - 75 %      Post-Education Assessment   Patient understands the diabetes disease and treatment process. Demonstrates understanding / competency    Patient understands incorporating nutritional management into lifestyle. Demonstrates understanding / competency    Patient undertands incorporating physical activity into lifestyle. Demonstrates understanding / competency    Patient understands using medications safely. Demonstrates understanding / competency    Patient understands monitoring blood glucose, interpreting and using results Demonstrates understanding / competency    Patient understands prevention, detection, and treatment of acute complications. Demonstrates understanding / competency    Patient understands prevention, detection, and treatment of chronic complications. Demonstrates understanding / competency    Patient understands how to develop strategies to address psychosocial issues. Demonstrates understanding / competency    Patient understands how to develop strategies to promote health/change behavior. Demonstrates understanding / competency      Outcomes   Expected Outcomes Demonstrated interest in learning. Expect positive outcomes    Future DMSE PRN    Program Status Not Completed      Subsequent Visit   Since your last visit have you continued or begun to take your medications as prescribed? Yes    Since your last visit have you had your blood pressure checked? No    Since your last visit have you experienced any weight changes? No change    Since your last visit, are you checking your blood glucose at least once a day? Yes   Freestyle Libre          Individualized Plan for Diabetes Self-Management Training:   Learning Objective:  Patient will have a greater understanding of diabetes self-management. Patient education plan is to attend individual and/or  group sessions per assessed needs and concerns.   Plan:   Patient Instructions  Try the Gatorade Super Shake within 30 minutes of finishing your workouts. Follow that up with a meal following the balanced plate method includes a protein, STARCH, and non-starchy vegetables.  Follow your blood sugar patterns on your [facility]. See how exercise affects your blood sugar, and how your post workout drink and meal help with refueling your body.  Have a carb with your 2 am to see if it helps to keep you from going low in the morning.   Expected Outcomes:  Demonstrated interest in learning. Expect positive outcomes  Education material provided: Exercise Case Study information, Exercise case study medical release form  If problems or questions, patient to contact team via:  Phone and Email  Future DSME appointment: PRN

## 2020-12-20 NOTE — Patient Instructions (Addendum)
Try the Gatorade Super Shake within 30 minutes of finishing your workouts. Follow that up with a meal following the balanced plate method includes a protein, STARCH, and non-starchy vegetables.  Follow your blood sugar patterns on your [facility]. See how exercise affects your blood sugar, and how your post workout drink and meal help with refueling your body.  Have a carb with your 2 am to see if it helps to keep you from going low in the morning.

## 2020-12-22 ENCOUNTER — Ambulatory Visit: Payer: Managed Care, Other (non HMO) | Admitting: Dietician

## 2021-03-25 ENCOUNTER — Ambulatory Visit: Payer: Managed Care, Other (non HMO) | Admitting: Internal Medicine

## 2021-03-25 NOTE — Progress Notes (Deleted)
Name: [name]  Age/ Sex: 40 y.o., male   MRN/ DOB: [id number], 28-Feb-1981     PCP: Robert Lima, MD   Reason for Endocrinology Evaluation: Type 2 Diabetes Mellitus  Initial Endocrine Consultative Visit: 09/08/2020    PATIENT IDENTIFIER: [name] is a 40 y.o. male with a past medical history of T2DM, OSA and GERD. The patient has followed with Endocrinology clinic since 09/08/2020 for consultative assistance with management of his diabetes.  DIABETIC HISTORY:  Robert [name] was diagnosed with DM in 2015. He was initially on diet modification regimen but insulin was started 08/2020 . His hemoglobin A1c has ranged from 7.3% in 2019, peaking at 13.3% in 2021.   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 We adjusted MDI regimen and started Metformin    Farxiga started 12/2020  SUBJECTIVE:   During the last visit (12/20/2020): A1c 6.8% We adjusted MDI regimen, continued metformin and started farxiga    Today (03/25/2021): Robert [name] is here for a follow up on diabetes management.  He checks his blood sugars multiple times daily, through CGM. The patient has  had hypoglycemic episodes since the last clinic visit. The patient is  symptomatic with these episodes. He denies any nausea and vomiting.    HOME DIABETES REGIMEN:  Basaglar 36 units daily  Metformin 500 mg XR 2 tabs BID  Farxiga 5 mg daily  CF ( BG-130/25)    Statin: yes ACE-I/ARB: no     CONTINUOUS GLUCOSE MONITORING RECORD INTERPRETATION    Dates of Recording: 12/21-12/21/2019  Sensor description:freestyle Libre  Results statistics:   CGM use % of time 65  Average and SD 116/32.3  Time in range      87  %  % Time Above 180 6  % Time above 250 0  % Time Below target 6     Glycemic patterns summary: optimal most of the day   Hyperglycemic episodes  N/A  Hypoglycemic episodes occurred during the night and day at time    Overnight periods: trends down       DIABETIC COMPLICATIONS: Microvascular complications:    Denies: CKD, retinopathy , neuropathy   Last Eye Exam: Completed 08/2020  Macrovascular complications:    Denies: CAD, CVA, PVD   HISTORY:  Past Medical History:  Past Medical History:  Diagnosis Date  . Asthma   . GERD (gastroesophageal reflux disease)     Past Surgical History: No past surgical history on file.  Social History:  reports that he has never smoked. He has never used smokeless tobacco. He reports that he does not drink alcohol and does not use drugs. Family History:  Family History  Problem Relation Age of Onset  . Hypertension Other   . Asthma Son   . Cancer Neg Hx   . Diabetes Neg Hx   . Early death Neg Hx   . Heart disease Neg Hx   . Hyperlipidemia Neg Hx   . Kidney disease Neg Hx   . Stroke Neg Hx      HOME MEDICATIONS: Allergies as of 03/25/2021   No Known Allergies     Medication List       Accurate as of March 25, 2021  7:17 AM. If you have any questions, ask your nurse or doctor.        Accu-Chek Guide Me w/Device  Kit 1 Act by Does not apply route in the morning, at noon, and at bedtime.   aspirin EC 81 MG tablet Take 1 tablet (81 mg total) by mouth daily.   Basaglar KwikPen 100 UNIT/ML Inject 36 Units into the skin daily.   dapagliflozin propanediol 5 MG Tabs tablet Commonly known as: Farxiga Take 1 tablet (5 mg total) by mouth daily.   FreeStyle Libre 14 Day Reader Kerrin Mo 1 Act by Does not apply route daily.   FreeStyle Libre 14 Day Sensor Misc 1 Act by Does not apply route daily.   Gvoke HypoPen 2-Pack 1 MG/0.2ML Soaj Generic drug: Glucagon Inject 1 Act into the skin daily as needed.   HumaLOG KwikPen 200 UNIT/ML KwikPen Generic drug: insulin lispro Inject 10 Units into the skin with breakfast, with lunch, and with evening meal.   Insulin Pen Needle 32G X 6 MM Misc 1 Act by Does not apply route daily.   metFORMIN  500 MG tablet Commonly known as: GLUCOPHAGE Take 2 tablets (1,000 mg total) by mouth 2 (two) times daily with a meal.   rosuvastatin 5 MG tablet Commonly known as: CRESTOR Take 1 tablet (5 mg total) by mouth daily.        OBJECTIVE:   Vital Signs: There were no vitals taken for this visit.  Wt Readings from Last 3 Encounters:  12/20/20 243 lb 9.6 oz (110.5 kg)  12/20/20 243 lb 4 oz (110.3 kg)  09/20/20 207 lb (93.9 kg)     Exam: General: Pt appears well and is in NAD  Lungs: Clear with good BS bilat with no rales, rhonchi, or wheezes  Heart: RRR with normal S1 and S2 and no gallops; no murmurs; no rub  Abdomen: Normoactive bowel sounds, soft, nontender, without masses or organomegaly palpable  Extremities: No pretibial edema  Neuro: MS is good with appropriate affect, pt is alert and Ox3      DM foot exam: 09/08/2020   The skin of the feet is intact without sores or ulcerations. The pedal pulses are 2+ on right and 2+ on left. The sensation is intact to a screening 5.07, 10 gram monofilament bilaterally    DATA REVIEWED:  Lab Results  Component Value Date   HGBA1C 6.8 (A) 12/20/2020   HGBA1C 13.3 (A) 08/05/2020   HGBA1C 7.3 (A) 06/12/2018   Lab Results  Component Value Date   MICROALBUR 2.1 08/05/2020   LDLCALC 97 08/05/2020   CREATININE 1.09 08/05/2020   Lab Results  Component Value Date   MICRALBCREAT 33 (H) 08/05/2020     Lab Results  Component Value Date   CHOL 152 08/05/2020   HDL 35 (L) 08/05/2020   LDLCALC 97 08/05/2020   TRIG 104 08/05/2020   CHOLHDL 4.3 08/05/2020         ASSESSMENT / PLAN / RECOMMENDATIONS:    1) Type 2 Diabetes Mellitus, Optimally controlled, With microalbuminuria  complications - Most recent A1c of 6.8 %. Goal A1c < 7.0 %.    - I have congratulated him on optimal glycemic control and encouraged him to continue with lifestyle changes  - We have discussed reducing basal insulin as below, as well as switching  prandial insulin from a standing premeal dosing to correctional factors only.  - We discussed adding SGLt-2 inhibitors, cautioned against genital infection    MEDICATIONS: - Decrease Basaglar to 36 units daily  - Continue Metformin 500 mg 2 tablets with breakfast and 2 tablet with Supper - Start [place] 5  mg, 1 tablet with Breakfast  - Correction Factor : Humalog ( BG -130/25)   EDUCATION / INSTRUCTIONS:  BG monitoring instructions: Patient is instructed to check his blood sugars 3 times a day, before meals   Call [facility] if: BG persistently < 70  . I reviewed the Rule of 15 for the treatment of hypoglycemia in detail with the patient. Literature supplied.    2) Diabetic complications:   Eye: Does not have known diabetic retinopathy.   Neuro/ Feet: Does not have known diabetic peripheral neuropathy.  Renal: Patient does not have known baseline CKD. He is not on an ACEI/ARB at present.   3) Dyslipidemia : Patient is on rosuvastatin 5 mg daily which was started recently . This was started on 07/2020     F/U in 3 months    Signed electronically by: Mack Guise, MD  [facility] Endocrin[facility]  [facility] [address] [place], [place] [address] Phone: [phone] FAX: [phone]   CC: Robert Lima, MD [place] [address] Phone: [phone]  Fax: [phone]  Return to Endocrinology clinic as below: Future Appointments  Date Time Provider [place]  03/25/2021  7:30 AM Corneluis Allston, Melanie Crazier, MD [facility] None

## 2021-08-29 ENCOUNTER — Other Ambulatory Visit: Payer: Self-pay | Admitting: Internal Medicine

## 2021-08-29 DIAGNOSIS — E785 Hyperlipidemia, unspecified: Secondary | ICD-10-CM

## 2021-08-30 NOTE — Telephone Encounter (Signed)
Vm left for patient callback and schedule appt before courtesy refill can be sent

## 2021-10-21 ENCOUNTER — Ambulatory Visit (INDEPENDENT_AMBULATORY_CARE_PROVIDER_SITE_OTHER): Payer: Managed Care, Other (non HMO) | Admitting: Internal Medicine

## 2021-10-21 ENCOUNTER — Encounter: Payer: Self-pay | Admitting: Internal Medicine

## 2021-10-21 ENCOUNTER — Other Ambulatory Visit: Payer: Self-pay

## 2021-10-21 VITALS — BP 138/82 | HR 100 | Ht 76.0 in | Wt 236.6 lb

## 2021-10-21 DIAGNOSIS — E785 Hyperlipidemia, unspecified: Secondary | ICD-10-CM | POA: Diagnosis not present

## 2021-10-21 DIAGNOSIS — E1129 Type 2 diabetes mellitus with other diabetic kidney complication: Secondary | ICD-10-CM

## 2021-10-21 DIAGNOSIS — Z794 Long term (current) use of insulin: Secondary | ICD-10-CM | POA: Diagnosis not present

## 2021-10-21 DIAGNOSIS — E118 Type 2 diabetes mellitus with unspecified complications: Secondary | ICD-10-CM

## 2021-10-21 DIAGNOSIS — R809 Proteinuria, unspecified: Secondary | ICD-10-CM

## 2021-10-21 LAB — POCT GLYCOSYLATED HEMOGLOBIN (HGB A1C): Hemoglobin A1C: 6.9 % — AB (ref 4.0–5.6)

## 2021-10-21 LAB — LIPID PANEL
Cholesterol: 134 mg/dL (ref 0–200)
HDL: 33.8 mg/dL — ABNORMAL LOW (ref >39.00)
LDL Cholesterol: 71 mg/dL (ref 0–99)
NonHDL: 100.14
Total CHOL/HDL Ratio: 4
Triglycerides: 147 mg/dL (ref 0.0–149.0)
VLDL: 29.4 mg/dL (ref 0.0–40.0)

## 2021-10-21 LAB — BASIC METABOLIC PANEL
BUN: 21 mg/dL (ref 6–23)
CO2: 27 mEq/L (ref 19–32)
Calcium: 9.2 mg/dL (ref 8.4–10.5)
Chloride: 107 mEq/L (ref 96–112)
Creatinine, Ser: 1.45 mg/dL (ref 0.40–1.50)
GFR: 59.68 mL/min — ABNORMAL LOW (ref >60.00)
Glucose, Bld: 112 mg/dL — ABNORMAL HIGH (ref 70–99)
Potassium: 3.9 mEq/L (ref 3.5–5.1)
Sodium: 141 mEq/L (ref 135–145)

## 2021-10-21 LAB — GLUCOSE, POCT (MANUAL RESULT ENTRY): POC Glucose: 128 mg/dl — AB (ref 70–99)

## 2021-10-21 MED ORDER — DAPAGLIFLOZIN PROPANEDIOL 10 MG PO TABS: 10.0000 mg | ORAL_TABLET | Freq: Every day | ORAL | 3 refills | Status: DC

## 2021-10-21 MED ORDER — METFORMIN HCL 500 MG PO TABS: 500.0000 mg | ORAL_TABLET | Freq: Every day | ORAL | 3 refills | Status: DC

## 2021-10-21 MED ORDER — FREESTYLE LIBRE 2 SENSOR MISC
1.0000 | 3 refills | Status: DC
Start: 2021-10-21 — End: 2022-04-21

## 2021-10-21 MED ORDER — HUMALOG KWIKPEN 200 UNIT/ML ~~LOC~~ SOPN: 10.0000 [IU] | PEN_INJECTOR | Freq: Three times a day (TID) | SUBCUTANEOUS | 1 refills | Status: DC

## 2021-10-21 MED ORDER — BASAGLAR KWIKPEN 100 UNIT/ML ~~LOC~~ SOPN: 40.0000 [IU] | PEN_INJECTOR | Freq: Every day | SUBCUTANEOUS | 3 refills | Status: DC

## 2021-10-21 MED ORDER — ROSUVASTATIN CALCIUM 5 MG PO TABS: 5.0000 mg | ORAL_TABLET | Freq: Every day | ORAL | 3 refills | Status: DC

## 2021-10-21 MED ORDER — INSULIN PEN NEEDLE 32G X 6 MM MISC
1.0000 | Freq: Four times a day (QID) | 3 refills | Status: DC
Start: 2021-10-21 — End: 2023-10-30

## 2021-10-21 NOTE — Progress Notes (Signed)
Name: Robert [name]  Age/ Sex: 40 y.o., male   MRN/ DOB: [id number], September 30, 1981     PCP: Robert Lima, MD   Reason for Endocrinology Evaluation: Type 2 Diabetes Mellitus  Initial Endocrine Consultative Visit: 09/08/2020    PATIENT IDENTIFIER: Robert [name] is a 40 y.o. male with a past medical history of T2DM, OSA and GERD. The patient has followed with Endocrinology clinic since 09/08/2020 for consultative assistance with management of his diabetes.  DIABETIC HISTORY:  Robert [name] was diagnosed with DM in 2015. He was initially on diet modification regimen but insulin was started 08/2020 . His hemoglobin A1c has ranged from 7.3% in 2019, peaking at 13.3% in 2021.   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 We adjusted MDI regimen and started Metformin   Farxiga started 12/2020  SUBJECTIVE:   During the last visit (12/2020): A1c 6.8% we adjusted MDI regimen and started Metformin   Today (10/21/2021): Robert [name] is here for a follow up on diabetes management.  He has not been to our clinic in 10 months.  He checks his blood 1-2 week .  The patient has not had hypoglycemic episodes since the last clinic visit that he is aware of.   Metformin gives him diarrhea so he reduced it to one tablet.  Has been stressed out and has not been taking care of himself.     HOME DIABETES REGIMEN:  Basaglar 36 units daily - takes 45 units  Humalog 6/8/8 units - takes 10 units with each meal  Metformin 500 mg XR 2 tabs BID - takes one tablet Farxiga 5 mg daily    Statin: yes ACE-I/ARB: no     CONTINUOUS GLUCOSE MONITORING RECORD INTERPRETATION : not using      DIABETIC COMPLICATIONS: Microvascular complications:   Denies: CKD, retinopathy , neuropathy  Last Eye Exam: Completed 08/2020  Macrovascular complications:   Denies: CAD, CVA, PVD   HISTORY:  Past Medical History:  Past Medical History:   Diagnosis Date   Asthma    GERD (gastroesophageal reflux disease)    Past Surgical History: No past surgical history on file. Social History:  reports that he has never smoked. He has never used smokeless tobacco. He reports that he does not drink alcohol and does not use drugs. Family History:  Family History  Problem Relation Age of Onset   Hypertension Other    Asthma Son    Cancer Neg Hx    Diabetes Neg Hx    Early death Neg Hx    Heart disease Neg Hx    Hyperlipidemia Neg Hx    Kidney disease Neg Hx    Stroke Neg Hx      HOME MEDICATIONS: Allergies as of 10/21/2021   No Known Allergies      Medication List        Accurate as of October 21, 2021  2:52 PM. If you have any questions, ask your nurse or doctor.          Accu-Chek Guide Me w/Device Kit 1 Act by Does not apply route in the morning, at noon, and at bedtime.   aspirin EC 81 MG tablet Take 1 tablet (81 mg total) by mouth daily.   Basaglar KwikPen 100 UNIT/ML Inject 36 Units into the skin daily.   Farxiga 5 MG Tabs tablet Generic drug:  dapagliflozin propanediol TAKE 1 TABLET DAILY   FreeStyle Libre 14 Day Reader Kerrin Mo 1 Act by Does not apply route daily.   FreeStyle Libre 2 Sensor Misc 1 Device by Does not apply route every 14 (fourteen) days. What changed:  how much to take when to take this Changed by: Dorita Sciara, MD   Gvoke HypoPen 2-Pack 1 MG/0.2ML Soaj Generic drug: Glucagon Inject 1 Act into the skin daily as needed.   HumaLOG KwikPen 200 UNIT/ML KwikPen Generic drug: insulin lispro Inject 10 Units into the skin with breakfast, with lunch, and with evening meal.   Insulin Pen Needle 32G X 6 MM Misc 1 Act by Does not apply route daily.   metFORMIN 500 MG tablet Commonly known as: GLUCOPHAGE Take 2 tablets (1,000 mg total) by mouth 2 (two) times daily with a meal.   rosuvastatin 5 MG tablet Commonly known as: CRESTOR TAKE 1 TABLET DAILY         OBJECTIVE:    Vital Signs: BP 138/82 (BP Location: Left Arm, Patient Position: Sitting, Cuff Size: Small)   Pulse 100   Ht '6\' 4"'  (1.93 m)   Wt 236 lb 9.6 oz (107.3 kg)   SpO2 99%   BMI 28.80 kg/m   Wt Readings from Last 3 Encounters:  10/21/21 236 lb 9.6 oz (107.3 kg)  12/20/20 243 lb 9.6 oz (110.5 kg)  12/20/20 243 lb 4 oz (110.3 kg)     Exam: General: Pt appears well and is in NAD  Lungs: Clear with good BS bilat with no rales, rhonchi, or wheezes  Heart: RRR with normal S1 and S2 and no gallops; no murmurs; no rub  Abdomen: Normoactive bowel sounds, soft, nontender, without masses or organomegaly palpable  Extremities: No pretibial edema  Neuro: MS is good with appropriate affect, pt is alert and Ox3      DM foot exam: 10/21/2021     The skin of the feet is intact without sores or ulcerations. The pedal pulses are 2+ on right and 2+ on left. The sensation is intact to a screening 5.07, 10 gram monofilament bilaterally     DATA REVIEWED:  Lab Results  Component Value Date   HGBA1C 6.9 (A) 10/21/2021   HGBA1C 6.8 (A) 12/20/2020   HGBA1C 13.3 (A) 08/05/2020   Results for [name], [name] (MRN [id number]) as of 10/21/2021 17:09  Ref. Range 10/21/2021 15:06  Sodium Latest Ref Range: 135 - 145 mEq/L 141  Potassium Latest Ref Range: 3.5 - 5.1 mEq/L 3.9  Chloride Latest Ref Range: 96 - 112 mEq/L 107  CO2 Latest Ref Range: 19 - 32 mEq/L 27  Glucose Latest Ref Range: 70 - 99 mg/dL 112 (H)  BUN Latest Ref Range: 6 - 23 mg/dL 21  Creatinine Latest Ref Range: 0.40 - 1.50 mg/dL 1.45  Calcium Latest Ref Range: 8.4 - 10.5 mg/dL 9.2  GFR Latest Ref Range: >60.00 mL/min 59.68 (L)  Total CHOL/HDL Ratio Unknown 4  Cholesterol Latest Ref Range: 0 - 200 mg/dL 134  HDL Cholesterol Latest Ref Range: >39.00 mg/dL 33.80 (L)  LDL (calc) Latest Ref Range: 0 - 99 mg/dL 71  NonHDL Unknown 100.14  Triglycerides Latest Ref Range: 0.0 - 149.0 mg/dL 147.0  VLDL Latest Ref Range: 0.0 - 40.0 mg/dL 29.4    ASSESSMENT / PLAN / RECOMMENDATIONS:     1) Type 2 Diabetes Mellitus, Optimally controlled, With microalbuminuria  complications - Most recent A1c of 6.9 %. Goal A1c < 7.0 %.    -  A1c stable and optimal  - He is on more insulin then previously prescribed - Will increase farxiga and reduce basal  - NO changes to prandial insulin due to lack of sufficiency data   MEDICATIONS: - Decrease Basaglar to 40units daily  - Continue Metformin 500 mg 1 tablet daily  - Increase Farxiga 10 mg, 1 tablet with Breakfast  -Continue Humalog 10 units with each meal - Correction Factor : Humalog ( BG -130/25)   EDUCATION / INSTRUCTIONS: BG monitoring instructions: Patient is instructed to check his blood sugars 3 times a day, before meals  Call [place] [facility] if: BG persistently < 70  I reviewed the Rule of 15 for the treatment of hypoglycemia in detail with the patient. Literature supplied.    2) Diabetic complications:  Eye: Does not have known diabetic retinopathy.  Neuro/ Feet: Does not have known diabetic peripheral neuropathy. Renal: Patient does not have known baseline CKD. He is not on an ACEI/ARB at present.     3) Dyslipidemia : Patient is on rosuvastatin  since 07/2020   -LDL and TG at goal   Medication Continue rosuvastatin 5 mg daily  F/U in 3 months    Signed electronically by: Mack Guise, MD  [facility]  [facility] [address] [place], [place] [address] Phone: [phone] FAX: [phone]   CC: Robert Lima, MD [place] [address] Phone: [phone]  Fax: [phone]  Return to Endocrinology clinic as below: Future Appointments  Date Time Provider [place]  12/14/2021  8:40 AM Robert Lima, MD [id number] None

## 2021-10-21 NOTE — Patient Instructions (Addendum)
-   Decrease Basaglar 40  units daily  - Continue Metformin 500 mg 1 tablets with breakfast  - Increase Farxiga 10 mg, 1 tablet with Breakfast  - HUmalog 10 units with each meal -Humalog correctional insulin: Use the scale below to help guide you:   Blood sugar before meal Number of units to inject  Less than 155 0 unit  156 -  180 1 units  181 -  205 2 units  206 -  230 3 units  231 -  255 4 units  256 -  280 5 units  281 -  305 6 units       HOW TO TREAT LOW BLOOD SUGARS (Blood sugar LESS THAN 70 MG/DL) Please follow the RULE OF 15 for the treatment of hypoglycemia treatment (when your (blood sugars are less than 70 mg/dL)   STEP 1: Take 15 grams of carbohydrates when your blood sugar is low, which includes:  3-4 GLUCOSE TABS  OR 3-4 OZ OF JUICE OR REGULAR SODA OR ONE TUBE OF GLUCOSE GEL    STEP 2: RECHECK blood sugar in 15 MINUTES STEP 3: If your blood sugar is still low at the 15 minute recheck --> then, go back to STEP 1 and treat AGAIN with another 15 grams of carbohydrates.

## 2021-12-14 ENCOUNTER — Encounter: Payer: Self-pay | Admitting: Internal Medicine

## 2021-12-14 ENCOUNTER — Other Ambulatory Visit: Payer: Self-pay

## 2021-12-14 ENCOUNTER — Ambulatory Visit (INDEPENDENT_AMBULATORY_CARE_PROVIDER_SITE_OTHER): Payer: Managed Care, Other (non HMO) | Admitting: Internal Medicine

## 2021-12-14 VITALS — BP 126/86 | HR 92 | Temp 98.3°F | Ht 76.0 in | Wt 237.0 lb

## 2021-12-14 DIAGNOSIS — R6882 Decreased libido: Secondary | ICD-10-CM | POA: Diagnosis not present

## 2021-12-14 DIAGNOSIS — Z0000 Encounter for general adult medical examination without abnormal findings: Secondary | ICD-10-CM

## 2021-12-14 DIAGNOSIS — N401 Enlarged prostate with lower urinary tract symptoms: Secondary | ICD-10-CM

## 2021-12-14 DIAGNOSIS — E118 Type 2 diabetes mellitus with unspecified complications: Secondary | ICD-10-CM

## 2021-12-14 DIAGNOSIS — N5201 Erectile dysfunction due to arterial insufficiency: Secondary | ICD-10-CM | POA: Diagnosis not present

## 2021-12-14 DIAGNOSIS — K219 Gastro-esophageal reflux disease without esophagitis: Secondary | ICD-10-CM

## 2021-12-14 DIAGNOSIS — N138 Other obstructive and reflux uropathy: Secondary | ICD-10-CM | POA: Diagnosis not present

## 2021-12-14 LAB — CBC WITH DIFFERENTIAL/PLATELET
Basophils Absolute: 0 10*3/uL (ref 0.0–0.1)
Basophils Relative: 1 % (ref 0.0–3.0)
Eosinophils Absolute: 0.1 10*3/uL (ref 0.0–0.7)
Eosinophils Relative: 2.3 % (ref 0.0–5.0)
HCT: 46.1 % (ref 39.0–52.0)
Hemoglobin: 15.8 g/dL (ref 13.0–17.0)
Lymphocytes Relative: 57 % — ABNORMAL HIGH (ref 12.0–46.0)
Lymphs Abs: 2.4 10*3/uL (ref 0.7–4.0)
MCHC: 34.3 g/dL (ref 30.0–36.0)
MCV: 85.3 fl (ref 78.0–100.0)
Monocytes Absolute: 0.3 10*3/uL (ref 0.1–1.0)
Monocytes Relative: 6.8 % (ref 3.0–12.0)
Neutro Abs: 1.4 10*3/uL (ref 1.4–7.7)
Neutrophils Relative %: 32.9 % — ABNORMAL LOW (ref 43.0–77.0)
Platelets: 182 10*3/uL (ref 150.0–400.0)
RBC: 5.41 Mil/uL (ref 4.22–5.81)
RDW: 13.3 % (ref 11.5–15.5)
WBC: 4.2 10*3/uL (ref 4.0–10.5)

## 2021-12-14 LAB — TSH: TSH: 2.41 u[IU]/mL (ref 0.35–5.50)

## 2021-12-14 LAB — MICROALBUMIN / CREATININE URINE RATIO
Creatinine,U: 190.2 mg/dL
Microalb Creat Ratio: 0.4 mg/g (ref 0.0–30.0)
Microalb, Ur: <0.7 mg/dL (ref 0.0–1.9)

## 2021-12-14 LAB — URINALYSIS, ROUTINE W REFLEX MICROSCOPIC
Bilirubin Urine: NEGATIVE
Hgb urine dipstick: NEGATIVE
Leukocytes,Ua: NEGATIVE
Nitrite: NEGATIVE
RBC / HPF: NONE SEEN (ref 0–?)
Specific Gravity, Urine: 1.025 (ref 1.000–1.030)
Total Protein, Urine: NEGATIVE
Urine Glucose: >=1000 — AB
Urobilinogen, UA: 0.2 (ref 0.0–1.0)
WBC, UA: NONE SEEN (ref 0–?)
pH: 6 (ref 5.0–8.0)

## 2021-12-14 LAB — PSA: PSA: 1.66 ng/mL (ref 0.10–4.00)

## 2021-12-14 MED ORDER — ALFUZOSIN HCL ER 10 MG PO TB24: 10.0000 mg | ORAL_TABLET | Freq: Every day | ORAL | 1 refills | Status: DC

## 2021-12-14 NOTE — Progress Notes (Signed)
Subjective:  Patient ID: Robert [name], male    DOB: Mar 05, 1981  Age: 40 y.o. MRN: [id number]  CC: Annual Exam, Diabetes, and Gastroesophageal Reflux  This visit occurred during the SARS-CoV-2 public health emergency.  Safety protocols were in place, including screening questions prior to the visit, additional usage of staff PPE, and extensive cleaning of exam room while observing appropriate contact time as indicated for disinfecting solutions.    HPI Robert [name] presents for a CPX and f/up -   He is active and denies chest pain, shortness of breath, diaphoresis, dizziness, lightheadedness, edema, or fatigue.  He complains of a 45-monthhistory of low libido and erectile dysfunction.  Outpatient Medications Prior to Visit  Medication Sig Dispense Refill   aspirin EC 81 MG tablet Take 1 tablet (81 mg total) by mouth daily. 90 tablet 1   Blood Glucose Monitoring Suppl (ACCU-CHEK GUIDE ME) w/Device KIT 1 Act by Does not apply route in the morning, at noon, and at bedtime. 1 kit 0   Continuous Blood Gluc Receiver (FREESTYLE LIBRE 14 DAY READER) DEVI 1 Act by Does not apply route daily. 6 each 1   Continuous Blood Gluc Sensor (FREESTYLE LIBRE 2 SENSOR) MISC 1 Device by Does not apply route every 14 (fourteen) days. 6 each 3   dapagliflozin propanediol (FARXIGA) 10 MG TABS tablet Take 1 tablet (10 mg total) by mouth daily before breakfast. 90 tablet 3   Insulin Glargine (BASAGLAR KWIKPEN) 100 UNIT/ML Inject 40 Units into the skin daily. 45 mL 3   insulin lispro (HUMALOG KWIKPEN) 200 UNIT/ML KwikPen Inject 10 Units into the skin with breakfast, with lunch, and with evening meal. 45 mL 1   Insulin Pen Needle 32G X 6 MM MISC 1 Device by Does not apply route in the morning, at noon, in the evening, and at bedtime. 400 each 3   metFORMIN (GLUCOPHAGE) 500 MG tablet Take 1 tablet (500 mg total) by mouth daily with breakfast. 90 tablet 3   rosuvastatin (CRESTOR) 5 MG tablet Take 1 tablet (5 mg total) by  mouth daily. 90 tablet 3   Glucagon (GVOKE HYPOPEN 2-PACK) 1 MG/0.2ML SOAJ Inject 1 Act into the skin daily as needed. 2 mL 5   No facility-administered medications prior to visit.    ROS Review of Systems  Constitutional:  Negative for chills, diaphoresis, fatigue and fever.  HENT: Negative.    Eyes: Negative.   Respiratory:  Negative for cough, choking, shortness of breath and wheezing.   Cardiovascular:  Negative for chest pain, palpitations and leg swelling.  Gastrointestinal:  Negative for abdominal pain, constipation, diarrhea, nausea and vomiting.  Endocrine: Negative.   Genitourinary:  Positive for difficulty urinating (weak urine stream). Negative for dysuria, hematuria, penile swelling, scrotal swelling, testicular pain and urgency.       ++ED and low libido for 6 months  Musculoskeletal: Negative.  Negative for arthralgias and myalgias.  Skin: Negative.  Negative for color change and pallor.  Neurological: Negative.  Negative for dizziness, weakness, light-headedness, numbness and headaches.  Hematological:  Negative for adenopathy. Does not bruise/bleed easily.  Psychiatric/Behavioral: Negative.     Objective:  BP 126/86 (BP Location: Right Arm, Patient Position: Sitting, Cuff Size: Large)    Pulse 92    Temp 98.3 F (36.8 C) (Oral)    Ht '6\' 4"'  (1.93 m)    Wt 237 lb (107.5 kg)    SpO2 94%    BMI 28.85 kg/m   BP Readings  from Last 3 Encounters:  12/14/21 126/86  10/21/21 138/82  12/20/20 126/72    Wt Readings from Last 3 Encounters:  12/14/21 237 lb (107.5 kg)  10/21/21 236 lb 9.6 oz (107.3 kg)  12/20/20 243 lb 9.6 oz (110.5 kg)    Physical Exam Vitals reviewed.  HENT:     Nose: Nose normal.     Mouth/Throat:     Mouth: Mucous membranes are moist.  Eyes:     General: No scleral icterus.    Conjunctiva/sclera: Conjunctivae normal.  Cardiovascular:     Rate and Rhythm: Normal rate and regular rhythm.     Heart sounds: No murmur heard. Pulmonary:      Effort: Pulmonary effort is normal.     Breath sounds: No stridor. No wheezing, rhonchi or rales.  Abdominal:     General: Abdomen is flat.     Palpations: There is no mass.     Tenderness: There is no abdominal tenderness. There is no guarding.     Hernia: No hernia is present. There is no hernia in the left inguinal area or right inguinal area.  Genitourinary:    Pubic Area: No rash.      Penis: Normal and circumcised.      Testes: Normal.     Epididymis:     Right: Normal. Not inflamed or enlarged. No mass.     Left: Normal. Not inflamed or enlarged. No mass.     Prostate: Enlarged. Not tender and no nodules present.     Rectum: Normal. Guaiac result negative. No mass, tenderness, anal fissure, external hemorrhoid or internal hemorrhoid. Normal anal tone.  Musculoskeletal:        General: Normal range of motion.     Cervical back: Neck supple.     Right lower leg: No edema.     Left lower leg: No edema.  Lymphadenopathy:     Cervical: No cervical adenopathy.     Lower Body: No right inguinal adenopathy. No left inguinal adenopathy.  Skin:    General: Skin is warm and dry.     Coloration: Skin is not pale.  Neurological:     General: No focal deficit present.     Mental Status: He is alert.  Psychiatric:        Mood and Affect: Mood normal.        Behavior: Behavior normal.    Lab Results  Component Value Date   WBC 4.2 12/14/2021   HGB 15.8 12/14/2021   HCT 46.1 12/14/2021   PLT 182.0 12/14/2021   GLUCOSE 112 (H) 10/21/2021   CHOL 134 10/21/2021   TRIG 147.0 10/21/2021   HDL 33.80 (L) 10/21/2021   LDLCALC 71 10/21/2021   ALT 11 08/05/2020   AST 12 08/05/2020   NA 141 10/21/2021   K 3.9 10/21/2021   CL 107 10/21/2021   CREATININE 1.45 10/21/2021   BUN 21 10/21/2021   CO2 27 10/21/2021   TSH 2.41 12/14/2021   PSA 1.66 12/14/2021   HGBA1C 6.9 (A) 10/21/2021   MICROALBUR <0.7 12/14/2021    No results found.  Assessment & Plan:   Bow was seen today for  annual exam, diabetes and gastroesophageal reflux.  Diagnoses and all orders for this visit:  Type II diabetes mellitus with manifestations ([place])- His blood sugar is adequately well controlled. -     Microalbumin / creatinine urine ratio; Future -     HM Diabetes Foot Exam -     Microalbumin / creatinine  urine ratio  Routine general medical examination at a health care facility- Exam completed, labs reviewed, vaccines reviewed and updated, cancer screenings addressed, patient education was given. -     PSA; Future -     PSA  Gastroesophageal reflux disease without esophagitis- He has no symptoms related to this. -     CBC with Differential/Platelet; Future -     CBC with Differential/Platelet  Erectile dysfunction due to arterial insufficiency- Will treat with a PDE-5 inh. Labs are negative for secondary causes. -     TSH; Future -     Testosterone Total,Free,Bio, Males; Future -     Prolactin; Future -     Prolactin -     Testosterone Total,Free,Bio, Males -     TSH  Low libido- Labs are negative for secondary causes. -     TSH; Future -     Testosterone Total,Free,Bio, Males; Future -     Prolactin; Future -     Prolactin -     Testosterone Total,Free,Bio, Males -     TSH  BPH with obstruction/lower urinary tract symptoms- Will treat with a peripheral alpha-blocker. -     Urinalysis, Routine w reflex microscopic; Future -     Urinalysis, Routine w reflex microscopic -     alfuzosin (UROXATRAL) 10 MG 24 hr tablet; Take 1 tablet (10 mg total) by mouth daily with breakfast.   I have discontinued Fawzi D. Brightbill's Gvoke HypoPen 2-Pack. I am also having him start on alfuzosin. Additionally, I am having him maintain his Accu-Chek Guide Me, FreeStyle Libre 14 Day Reader, aspirin EC, FreeStyle Libre 2 Sensor, Insulin Pen Needle, metFORMIN, Basaglar KwikPen, HumaLOG KwikPen, dapagliflozin propanediol, and rosuvastatin.  Meds ordered this encounter  Medications   alfuzosin  (UROXATRAL) 10 MG 24 hr tablet    Sig: Take 1 tablet (10 mg total) by mouth daily with breakfast.    Dispense:  90 tablet    Refill:  1     Follow-up: Return in about 6 months (around 06/14/2022).  Scarlette Calico, MD

## 2021-12-14 NOTE — Patient Instructions (Signed)

## 2021-12-15 LAB — TESTOSTERONE TOTAL,FREE,BIO, MALES
Albumin: 4.5 g/dL (ref 3.6–5.1)
Sex Hormone Binding: 37 nmol/L (ref 10–50)
Testosterone, Bioavailable: 169.9 ng/dL (ref 110.0–575.0)
Testosterone, Free: 82.6 pg/mL (ref 46.0–224.0)
Testosterone: 645 ng/dL (ref 250–827)

## 2021-12-15 LAB — PROLACTIN: Prolactin: 6.4 ng/mL (ref 2.0–18.0)

## 2021-12-16 ENCOUNTER — Encounter: Payer: Self-pay | Admitting: Internal Medicine

## 2021-12-16 DIAGNOSIS — N5201 Erectile dysfunction due to arterial insufficiency: Secondary | ICD-10-CM

## 2021-12-16 DIAGNOSIS — N138 Other obstructive and reflux uropathy: Secondary | ICD-10-CM

## 2021-12-16 MED ORDER — TADALAFIL 5 MG PO TABS: 5.0000 mg | ORAL_TABLET | Freq: Every day | ORAL | 1 refills | Status: DC

## 2021-12-21 MED ORDER — TADALAFIL 5 MG PO TABS: 5.0000 mg | ORAL_TABLET | Freq: Every day | ORAL | 1 refills | Status: AC

## 2022-01-16 ENCOUNTER — Encounter: Payer: Self-pay | Admitting: Internal Medicine

## 2022-01-17 ENCOUNTER — Other Ambulatory Visit: Payer: Self-pay

## 2022-01-17 DIAGNOSIS — E1129 Type 2 diabetes mellitus with other diabetic kidney complication: Secondary | ICD-10-CM

## 2022-01-17 MED ORDER — HUMALOG KWIKPEN 200 UNIT/ML ~~LOC~~ SOPN: 10.0000 [IU] | PEN_INJECTOR | Freq: Three times a day (TID) | SUBCUTANEOUS | 1 refills | Status: DC

## 2022-01-17 MED ORDER — DAPAGLIFLOZIN PROPANEDIOL 10 MG PO TABS: 10.0000 mg | ORAL_TABLET | Freq: Every day | ORAL | 3 refills | Status: DC

## 2022-01-17 MED ORDER — BASAGLAR KWIKPEN 100 UNIT/ML ~~LOC~~ SOPN: 40.0000 [IU] | PEN_INJECTOR | Freq: Every day | SUBCUTANEOUS | 3 refills | Status: DC

## 2022-01-17 MED ORDER — METFORMIN HCL 500 MG PO TABS: 500.0000 mg | ORAL_TABLET | Freq: Every day | ORAL | 3 refills | Status: DC

## 2022-01-17 NOTE — Telephone Encounter (Signed)
Medication sent to [facility] per patient request

## 2022-04-11 ENCOUNTER — Other Ambulatory Visit: Payer: Self-pay | Admitting: Internal Medicine

## 2022-04-11 DIAGNOSIS — E1129 Type 2 diabetes mellitus with other diabetic kidney complication: Secondary | ICD-10-CM

## 2022-04-20 NOTE — Progress Notes (Signed)
?Name: Robert [name]  ?Age/ Sex: 41 y.o., male   ?MRN/ DOB: [id number], 06-09-1981    ? ?PCP: Janith Lima, MD   ?Reason for Endocrinology Evaluation: Type 2 Diabetes Mellitus  ?Initial Endocrine Consultative Visit: 09/08/2020  ? ? ?PATIENT IDENTIFIER: Robert [name] is a 41 y.o. male with a past medical history of T2DM, OSA and GERD. The patient has followed with Endocrinology clinic since 09/08/2020 for consultative assistance with management of his diabetes. ? ?DIABETIC HISTORY:  ?Robert [name] was diagnosed with DM in 2015. He was initially on diet modification regimen but insulin was started 08/2020 . His hemoglobin A1c has ranged from 7.3% in 2019, peaking at 13.3% in 2021. ? ? 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 ? ?On his initial visit to our clinic he had an A1c of 13.3%. We adjusted MDI regimen and started Metformin  ? ?[place] started 12/2020 ? ?SUBJECTIVE:  ? ?During the last visit (10/21/2021): A1c 6.9% we adjusted MDI regimen and started Metformin  ? ?Today (04/21/2022): Robert [name] is here for a follow up on diabetes management.  He has not been checking glucose at home.    ? ? ?Denies nausea, vomiting, diarrhea  ? ? ?HOME DIABETES REGIMEN:  ?Basaglar 40 units daily  ?Humalog 10 units TIDQAC ?Metformin 500 mg XR daily  ?Farxiga 10 mg daily ?CF: Humalog (BG-130/25) ? ? ? ?Statin: yes ?ACE-I/ARB: no ? ? ? ? ?CONTINUOUS GLUCOSE MONITORING RECORD INTERPRETATION : not using   ? ? ? ?DIABETIC COMPLICATIONS: ?Microvascular complications:  ? ?Denies: CKD, retinopathy , neuropathy  ?Last Eye Exam: Completed 10/2021 ? ?Macrovascular complications:  ? ?Denies: CAD, CVA, PVD ? ? ?HISTORY:  ?Past Medical History:  ?Past Medical History:  ?Diagnosis Date  ? Asthma   ? GERD (gastroesophageal reflux disease)   ? ?Past Surgical History: No past surgical history on file. ?Social History:  reports that he has never smoked. He has never used smokeless tobacco. He reports that he does not drink alcohol and  does not use drugs. ?Family History:  ?Family History  ?Problem Relation Age of Onset  ? Hypertension Other   ? Asthma Son   ? Cancer Neg Hx   ? Diabetes Neg Hx   ? Early death Neg Hx   ? Heart disease Neg Hx   ? Hyperlipidemia Neg Hx   ? Kidney disease Neg Hx   ? Stroke Neg Hx   ? ? ? ?HOME MEDICATIONS: ?Allergies as of 04/21/2022   ?No Known Allergies ?  ? ?  ?Medication List  ?  ? ?  ? Accurate as of Apr 21, 2022  7:40 AM. If you have any questions, ask your nurse or doctor.  ?  ?  ? ?  ? ?Accu-Chek Guide Me w/Device Kit ?1 Act by Does not apply route in the morning, at noon, and at bedtime. ?  ?alfuzosin 10 MG 24 hr tablet ?Commonly known as: UROXATRAL ?Take 1 tablet (10 mg total) by mouth daily with breakfast. ?  ?aspirin EC 81 MG tablet ?Take 1 tablet (81 mg total) by mouth daily. ?  ?Basaglar KwikPen 100 UNIT/ML ?Inject 40 Units into the skin daily. ?  ?dapagliflozin propanediol 10 MG Tabs tablet ?Commonly known as: [place] ?Take 1 tablet (10 mg total) by mouth daily before breakfast. ?  ?FreeStyle Libre 14 Day Reader Kerrin Mo ?1 Act by Does not apply route daily. ?  ?FreeStyle Libre 2 Sensor  Misc ?1 Device by Does not apply route every 14 (fourteen) days. ?  ?HumaLOG KwikPen 200 UNIT/ML KwikPen ?Generic drug: insulin lispro ?Inject 10 Units into the skin with breakfast, with lunch, and with evening meal. ?  ?Insulin Pen Needle 32G X 6 MM Misc ?1 Device by Does not apply route in the morning, at noon, in the evening, and at bedtime. ?  ?metFORMIN 500 MG tablet ?Commonly known as: GLUCOPHAGE ?Take 1 tablet (500 mg total) by mouth daily with breakfast. ?  ?rosuvastatin 5 MG tablet ?Commonly known as: CRESTOR ?Take 1 tablet (5 mg total) by mouth daily. ?  ?tadalafil 5 MG tablet ?Commonly known as: CIALIS ?Take 1 tablet (5 mg total) by mouth daily. ?  ? ?  ? ? ? ?OBJECTIVE:  ? ?Vital Signs: BP 130/80 (BP Location: Left Arm, Patient Position: Sitting, Cuff Size: Small)   Pulse 90   Ht '6\' 4"'  (1.93 m)   Wt 230 lb (104.3  kg)   SpO2 99%   BMI 28.00 kg/m?   ?Wt Readings from Last 3 Encounters:  ?04/21/22 230 lb (104.3 kg)  ?12/14/21 237 lb (107.5 kg)  ?10/21/21 236 lb 9.6 oz (107.3 kg)  ? ? ? ?Exam: ?General: Pt appears well and is in NAD  ?Lungs: Clear with good BS bilat with no rales, rhonchi, or wheezes  ?Heart: RRR with normal S1 and S2 and no gallops; no murmurs; no rub  ?Abdomen: Normoactive bowel sounds, soft, nontender, without masses or organomegaly palpable  ?Extremities: No pretibial edema  ?Neuro: MS is good with appropriate affect, pt is alert and Ox3  ? ?   ?DM foot exam: 10/21/2021 ?  ?  ?The skin of the feet is intact without sores or ulcerations. ?The pedal pulses are 2+ on right and 2+ on left. ?The sensation is intact to a screening 5.07, 10 gram monofilament bilaterally ?  ? ? ?DATA REVIEWED: ? ?Lab Results  ?Component Value Date  ? HGBA1C 6.9 (A) 10/21/2021  ? HGBA1C 6.8 (A) 12/20/2020  ? HGBA1C 13.3 (A) 08/05/2020  ? ?Results for Robert [name], Robert [name] (MRN [id number]) as of 10/21/2021 17:09 ? Ref. Range 10/21/2021 15:06  ?Sodium Latest Ref Range: 135 - 145 mEq/L 141  ?Potassium Latest Ref Range: 3.5 - 5.1 mEq/L 3.9  ?Chloride Latest Ref Range: 96 - 112 mEq/L 107  ?CO2 Latest Ref Range: 19 - 32 mEq/L 27  ?Glucose Latest Ref Range: 70 - 99 mg/dL 112 (H)  ?BUN Latest Ref Range: 6 - 23 mg/dL 21  ?Creatinine Latest Ref Range: 0.40 - 1.50 mg/dL 1.45  ?Calcium Latest Ref Range: 8.4 - 10.5 mg/dL 9.2  ?GFR Latest Ref Range: >60.00 mL/min 59.68 (L)  ?Total CHOL/HDL Ratio Unknown 4  ?Cholesterol Latest Ref Range: 0 - 200 mg/dL 134  ?HDL Cholesterol Latest Ref Range: >39.00 mg/dL 33.80 (L)  ?LDL (calc) Latest Ref Range: 0 - 99 mg/dL 71  ?NonHDL Unknown 100.14  ?Triglycerides Latest Ref Range: 0.0 - 149.0 mg/dL 147.0  ?VLDL Latest Ref Range: 0.0 - 40.0 mg/dL 29.4  ? ?ASSESSMENT / PLAN / RECOMMENDATIONS:  ? ?  ?1) Type 2 Diabetes Mellitus, Optimally controlled, With microalbuminuria  complications - Most recent A1c of 6.6 %. Goal A1c <  7.0 %.  ? ? ?-I have praised the patient on continued optimization of glucose control ?-He has not been able to obtain freestyle Elenor Legato, he initially stated that the pharmacy told him they needed to talk to the provider, explained patient we did  not get any prior authorization request or communication from the pharmacy.  While in the office he did check through his phone and noted to have enough refills with the cost of $383. ?-I have explained to the patient that cash pay at a regular pharmacy is around $75, he was advised to contact his insurance company and question the high price, may be Dexcom would be better covered ?-Intolerant to higher doses of metformin ?- NO changes today ? ?MEDICATIONS: ?-Continue Basaglar 40 units daily  ?- Continue Metformin 500 mg 1 tablet daily  ?- Continue Farxiga 10 mg, 1 tablet with Breakfast  ?- Continue Humalog 10 units with each meal ?- Correction Factor : Humalog ( BG -130/25)  ? ?EDUCATION / INSTRUCTIONS: ?BG monitoring instructions: Patient is instructed to check his blood sugars 3 times a day, before meals  ?Call [facility] if: BG persistently < 70  ?I reviewed the Rule of 15 for the treatment of hypoglycemia in detail with the patient. Literature supplied. ? ? ? ?2) Diabetic complications:  ?Eye: Does not have known diabetic retinopathy.  ?Neuro/ Feet: Does not have known diabetic peripheral neuropathy. ?Renal: Patient does not have known baseline CKD. He is not on an ACEI/ARB at present. ?  ?  ?3) Dyslipidemia : Patient is on rosuvastatin  since 07/2020  ? ?-LDL and TG at goal ? ? ?Medication ?Continue rosuvastatin 5 mg daily ? ?F/U in 6 months  ? ? ?Signed electronically by: ?Abby Nena Jordan, MD ? ?[facility]  ?[facility] ?[address] ?[place], [place] [address] ?Phone: [phone] ?FAX: [phone] ? ? ?CC: ?Janith Lima, MD ?[place] [address] ?Phone: [phone]  ?Fax:  [phone] ? ?Return to Endocrinology clinic as below: ?No future appointments. ?  ? ? ?

## 2022-04-21 ENCOUNTER — Encounter: Payer: Self-pay | Admitting: Internal Medicine

## 2022-04-21 ENCOUNTER — Ambulatory Visit (INDEPENDENT_AMBULATORY_CARE_PROVIDER_SITE_OTHER): Payer: Managed Care, Other (non HMO) | Admitting: Internal Medicine

## 2022-04-21 VITALS — BP 130/80 | HR 90 | Ht 76.0 in | Wt 230.0 lb

## 2022-04-21 DIAGNOSIS — R809 Proteinuria, unspecified: Secondary | ICD-10-CM

## 2022-04-21 DIAGNOSIS — E1129 Type 2 diabetes mellitus with other diabetic kidney complication: Secondary | ICD-10-CM

## 2022-04-21 DIAGNOSIS — Z794 Long term (current) use of insulin: Secondary | ICD-10-CM

## 2022-04-21 DIAGNOSIS — R739 Hyperglycemia, unspecified: Secondary | ICD-10-CM

## 2022-04-21 DIAGNOSIS — E785 Hyperlipidemia, unspecified: Secondary | ICD-10-CM | POA: Diagnosis not present

## 2022-04-21 LAB — POCT GLUCOSE (DEVICE FOR HOME USE): Glucose Fasting, POC: 122 mg/dL — AB (ref 70–99)

## 2022-04-21 LAB — POCT GLYCOSYLATED HEMOGLOBIN (HGB A1C): Hemoglobin A1C: 6.6 % — AB (ref 4.0–5.6)

## 2022-04-21 MED ORDER — FREESTYLE LIBRE 2 SENSOR MISC: 1.0000 | 3 refills | Status: DC

## 2022-04-21 NOTE — Patient Instructions (Signed)
-   Continue  Basaglar 40  units daily  ?- Continue Metformin 500 mg 1 tablets with breakfast  ?- Continue Farxiga 10 mg, 1 tablet with Breakfast  ?- HUmalog 10 units with each meal ?-Humalog correctional insulin: Use the scale below to help guide you:  ? ?Blood sugar before meal Number of units to inject  ?Less than 155 0 unit  ?156 -  180 1 units  ?181 -  205 2 units  ?206 -  230 3 units  ?231 -  255 4 units  ?256 -  280 5 units  ?281 -  305 6 units  ? ? ?Please contact insurance and ask why the cost of freestyle Josephine Igo is over $300. See if Dexcom is an alternative. If not, let me know and I will prescribe Freestyle libre to [employer] , my understanding its $75 for a cash pay  ? ? ?HOW TO TREAT LOW BLOOD SUGARS (Blood sugar LESS THAN 70 MG/DL) ?Please follow the RULE OF 15 for the treatment of hypoglycemia treatment (when your (blood sugars are less than 70 mg/dL)  ? ?STEP 1: Take 15 grams of carbohydrates when your blood sugar is low, which includes:  ?3-4 GLUCOSE TABS  OR ?3-4 OZ OF JUICE OR REGULAR SODA OR ?ONE TUBE OF GLUCOSE GEL   ? ?STEP 2: RECHECK blood sugar in 15 MINUTES ?STEP 3: If your blood sugar is still low at the 15 minute recheck --> then, go back to STEP 1 and treat AGAIN with another 15 grams of carbohydrates. ? ?

## 2022-04-22 ENCOUNTER — Encounter: Payer: Self-pay | Admitting: Internal Medicine

## 2022-04-23 ENCOUNTER — Ambulatory Visit: Payer: Self-pay

## 2022-04-24 MED ORDER — DEXCOM G7 SENSOR MISC: 1.0000 | 3 refills | Status: DC

## 2022-04-25 ENCOUNTER — Ambulatory Visit
Admission: EM | Admit: 2022-04-25 | Discharge: 2022-04-25 | Disposition: A | Discharge status: Home / Self Care | Payer: Managed Care, Other (non HMO) | Attending: Urgent Care | Admitting: Urgent Care

## 2022-04-25 DIAGNOSIS — L298 Other pruritus: Secondary | ICD-10-CM | POA: Diagnosis present

## 2022-04-25 DIAGNOSIS — N485 Ulcer of penis: Secondary | ICD-10-CM | POA: Diagnosis present

## 2022-04-25 LAB — POCT URINALYSIS DIP (MANUAL ENTRY)
Bilirubin, UA: NEGATIVE
Blood, UA: NEGATIVE
Glucose, UA: 500 mg/dL — AB
Ketones, POC UA: NEGATIVE mg/dL
Leukocytes, UA: NEGATIVE
Nitrite, UA: NEGATIVE
Protein Ur, POC: NEGATIVE mg/dL
Spec Grav, UA: >=1.03 — AB (ref 1.010–1.025)
Urobilinogen, UA: 0.2 E.U./dL
pH, UA: 5.5 (ref 5.0–8.0)

## 2022-04-25 MED ORDER — FLUCONAZOLE 150 MG PO TABS: 150.0000 mg | ORAL_TABLET | Freq: Every day | ORAL | 0 refills | Status: DC

## 2022-04-25 NOTE — Discharge Instructions (Signed)
You were tested today for gonorrhea, chlamydia, trichomonas. ?You were also tested for syphillis and HSV. ?We will call you with the results of your test once received. ?Please avoid all forms of intercourse until test results received, and if positive for any STI, all partners will need to complete entire course of antibiotics prior to resuming. ?As always, practice safer sexual practices by using protection each and every time, and limiting number of partners.  ? ?Your urine sample is positive for glucose, but this is expected for patients that take [place]. ?Due to your medication profile, I recommend you take a single dose of diflucan today. This should help if the itching is secondary to your DM medications. We should receive your lab tests back in 24-48 hours. ?

## 2022-04-25 NOTE — ED Triage Notes (Signed)
Pt states pain and itching to  penis for the past 2 weeks. ?

## 2022-04-25 NOTE — ED Provider Notes (Signed)
?[facility] ? ? ? ?CSN: [id number] ?Arrival date & time: 04/25/22  5625 ? ? ?  ? ?History   ?Chief Complaint ?Chief Complaint  ?Patient presents with  ? groin irritation  ? ? ?HPI ?Robert [name] is a 41 y.o. male.  ? ?Pleasant 41 year old male presents today with concerns of dysuria and itching for the past 2 weeks.  He denies any visible changes to the skin.  He states that the itching feels external.  He has been using an over-the-counter jock itch cream without resolution to his symptoms.  He is a diabetic and takes [place], but has been on this for 1 year and denies any adverse reactions to date.  He denies any penile discharge, swelling, rash, lymphadenopathy. Denies abdominal pain, n/v/d. No fever. Denies STI exposure but requests eval. States last A1C was 6.6%. Did not check glucose readings this morning. ? ? ? ?Past Medical History:  ?Diagnosis Date  ? Asthma   ? GERD (gastroesophageal reflux disease)   ? ? ?Patient Active Problem List  ? Diagnosis Date Noted  ? Low libido 12/14/2021  ? Erectile dysfunction due to arterial insufficiency 12/14/2021  ? 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 Dyslipidemia 09/09/2020  ? Type 2 diabetes mellitus with hyperglycemia, with long-term current use of insulin ([place]) 09/09/2020  ? Type 2 diabetes mellitus with microalbuminuria, with long-term current use of insulin ([place]) 09/09/2020  ? Lumbar strain, initial encounter 08/05/2020  ? Type II diabetes mellitus with manifestations ([place]) 08/17/2014  ? Gastroesophageal reflux disease without esophagitis 08/17/2014  ? OSA (obstructive sleep apnea) 01/05/2014  ? Routine general medical examination at a health care facility 01/02/2014  ? ? ?History reviewed. No pertinent surgical history. ? ? ? ? ?Home Medications   ? ?Prior to Admission medications   ?Medication Sig Start Date End Date Taking? Authorizing Provider  ?fluconazole (DIFLUCAN) 150 MG tablet Take 1 tablet (150 mg total) by mouth daily.  04/25/22  Yes Dashayla Theissen L, PA  ?alfuzosin (UROXATRAL) 10 MG 24 hr tablet Take 1 tablet (10 mg total) by mouth daily with breakfast. 12/14/21   Janith Lima, MD  ?aspirin EC 81 MG tablet Take 1 tablet (81 mg total) by mouth daily. 11/29/20   Janith Lima, MD  ?Blood Glucose Monitoring Suppl (ACCU-CHEK GUIDE ME) w/Device KIT 1 Act by Does not apply route in the morning, at noon, and at bedtime. 08/05/20   Janith Lima, MD  ?Continuous Blood Gluc Sensor (DEXCOM G7 SENSOR) MISC 1 Device by Does not apply route as directed. 04/24/22   Shamleffer, Melanie Crazier, MD  ?dapagliflozin propanediol (FARXIGA) 10 MG TABS tablet Take 1 tablet (10 mg total) by mouth daily before breakfast. 01/17/22   Shamleffer, Melanie Crazier, MD  ?Insulin Glargine (BASAGLAR KWIKPEN) 100 UNIT/ML Inject 40 Units into the skin daily. 01/17/22   Shamleffer, Melanie Crazier, MD  ?insulin lispro (HUMALOG KWIKPEN) 200 UNIT/ML KwikPen Inject 10 Units into the skin with breakfast, with lunch, and with evening meal. 01/17/22   Shamleffer, Melanie Crazier, MD  ?Insulin Pen Needle 32G X 6 MM MISC 1 Device by Does not apply route in the morning, at noon, in the evening, and at bedtime. 10/21/21   Shamleffer, Melanie Crazier, MD  ?metFORMIN (GLUCOPHAGE) 500 MG tablet Take 1 tablet (500 mg total) by mouth daily with breakfast. 01/17/22   Shamleffer, Melanie Crazier, MD  ?rosuvastatin (CRESTOR) 5 MG tablet Take 1 tablet (5 mg total) by mouth daily. 10/21/21  Shamleffer, Melanie Crazier, MD  ?tadalafil (CIALIS) 5 MG tablet Take 1 tablet (5 mg total) by mouth daily. 12/21/21   Janith Lima, MD  ? ? ?Family History ?Family History  ?Problem Relation Age of Onset  ? Hypertension Other   ? Asthma Son   ? Cancer Neg Hx   ? Diabetes Neg Hx   ? Early death Neg Hx   ? Heart disease Neg Hx   ? Hyperlipidemia Neg Hx   ? Kidney disease Neg Hx   ? Stroke Neg Hx   ? ? ?Social History ?Social History  ? ?Tobacco Use  ? Smoking status: Never  ? Smokeless tobacco: Never   ?Substance Use Topics  ? Alcohol use: No  ?  Alcohol/week: 0.0 standard drinks  ? Drug use: No  ? ? ? ?Allergies   ?Patient has no known allergies. ? ? ?Review of Systems ?Review of Systems  ?All other systems reviewed and are negative. ?As per hpi ? ?Physical Exam ?Triage Vital Signs ?ED Triage Vitals [04/25/22 0835]  ?Enc Vitals Group  ?   BP 121/85  ?   Pulse Rate 100  ?   Resp 16  ?   Temp 98.1 ?F (36.7 ?C)  ?   Temp Source Oral  ?   SpO2 96 %  ?   Weight   ?   Height   ?   Head Circumference   ?   Peak Flow   ?   Pain Score 2  ?   Pain Loc   ?   Pain Edu?   ?   Excl. in [place]?   ? ?No data found. ? ?Updated Vital Signs ?BP 121/85 (BP Location: Left Arm)   Pulse 100   Temp 98.1 ?F (36.7 ?C) (Oral)   Resp 16   SpO2 96%  ? ?Visual Acuity ?Right Eye Distance:   ?Left Eye Distance:   ?Bilateral Distance:   ? ?Right Eye Near:   ?Left Eye Near:    ?Bilateral Near:    ? ?Physical Exam ?Vitals and nursing note reviewed. Exam conducted with a chaperone present.  ?Constitutional:   ?   General: He is not in acute distress. ?   Appearance: Normal appearance. He is normal weight. He is not ill-appearing or toxic-appearing.  ?Eyes:  ?   Extraocular Movements: Extraocular movements intact.  ?   Conjunctiva/sclera: Conjunctivae normal.  ?   Pupils: Pupils are equal, round, and reactive to light.  ?Cardiovascular:  ?   Rate and Rhythm: Normal rate.  ?Pulmonary:  ?   Effort: Pulmonary effort is normal. No respiratory distress.  ?Genitourinary: ?   Pubic Area: No rash or pubic lice.   ?   Penis: Circumcised. Lesions (single ulceration with central scab to the L lateral penile shaft) present.   ?   Testes: Normal.  ?   Epididymis:  ?   Right: Normal.  ?   Left: Normal.  ?Lymphadenopathy:  ?   Cervical: No cervical adenopathy.  ?Skin: ?   General: Skin is warm.  ?Neurological:  ?   Mental Status: He is alert.  ? ? ? ?UC Treatments / Results  ?Labs ?(all labs ordered are listed, but only abnormal results are displayed) ?Labs  Reviewed  ?POCT URINALYSIS DIP (MANUAL ENTRY) - Abnormal; Notable for the following components:  ?    Result Value  ? Glucose, UA =500 (*)   ? Spec Grav, UA >=1.030 (*)   ? All other components  within normal limits  ?RPR  ?HSV(HERPES SIMPLEX VRS) I + II AB-IGG  ?CYTOLOGY, (ORAL, ANAL, URETHRAL) ANCILLARY ONLY  ? ? ?EKG ? ? ?Radiology ?No results found. ? ?Procedures ?Procedures (including critical care time) ? ?Medications Ordered in UC ?Medications - No data to display ? ?Initial Impression / Assessment and Plan / UC Course  ?I have reviewed the triage vital signs and the nursing notes. ? ?Pertinent labs & imaging results that were available during my care of the patient were reviewed by me and considered in my medical decision making (see chart for details). ? ?  ? ?Pruritus of groin - given farxiga use and known occurrence of candidiasis with this medication, will start diflucan x 1 tab. Aptima swab collected. ?Penile ulcer - clinically I suspect this is a healing abrasion from his pruritus, however RPR and HSV testing performed to r/o additional causes.  ? ?Final Clinical Impressions(s) / UC Diagnoses  ? ?Final diagnoses:  ?Pruritus of groin in male  ?Penile ulcer  ? ? ? ?Discharge Instructions   ? ?  ?You were tested today for gonorrhea, chlamydia, trichomonas. ?You were also tested for syphillis and HSV. ?We will call you with the results of your test once received. ?Please avoid all forms of intercourse until test results received, and if positive for any STI, all partners will need to complete entire course of antibiotics prior to resuming. ?As always, practice safer sexual practices by using protection each and every time, and limiting number of partners.  ? ?Your urine sample is positive for glucose, but this is expected for patients that take [place]. ?Due to your medication profile, I recommend you take a single dose of diflucan today. This should help if the itching is secondary to your DM medications.  We should receive your lab tests back in 24-48 hours. ? ? ? ? ?ED Prescriptions   ? ? Medication Sig Dispense Auth. Provider  ? fluconazole (DIFLUCAN) 150 MG tablet Take 1 tablet (150 mg total) by mouth daily

## 2022-04-26 ENCOUNTER — Ambulatory Visit: Payer: Self-pay

## 2022-04-26 LAB — HSV 2 ANTIBODY, IGG: HSV 2 IgG, Type Spec: 5.97 index — ABNORMAL HIGH (ref 0.00–0.90)

## 2022-04-26 LAB — RPR: RPR Ser Ql: NONREACTIVE

## 2022-04-26 LAB — HSV 1 ANTIBODY, IGG: HSV 1 Glycoprotein G Ab, IgG: <0.91 index (ref 0.00–0.90)

## 2022-04-27 LAB — CYTOLOGY, (ORAL, ANAL, URETHRAL) ANCILLARY ONLY
Chlamydia: NEGATIVE
Comment: NEGATIVE
Comment: NEGATIVE
Comment: NORMAL
Neisseria Gonorrhea: NEGATIVE
Trichomonas: NEGATIVE

## 2022-05-08 ENCOUNTER — Telehealth: Payer: Self-pay

## 2022-05-08 ENCOUNTER — Other Ambulatory Visit (HOSPITAL_COMMUNITY): Payer: Self-pay

## 2022-05-08 NOTE — Telephone Encounter (Signed)
PA not needed. Pt does have a high deductible plan. Test billing results return a copay of $290.10 for the G7 Receiver, and G7 Sensors are $344.35.

## 2022-05-08 NOTE — Telephone Encounter (Signed)
Can we process a PA for G7

## 2022-05-12 ENCOUNTER — Other Ambulatory Visit: Payer: Self-pay | Admitting: Internal Medicine

## 2022-05-12 DIAGNOSIS — N401 Enlarged prostate with lower urinary tract symptoms: Secondary | ICD-10-CM

## 2022-06-01 ENCOUNTER — Other Ambulatory Visit (HOSPITAL_COMMUNITY): Payer: Self-pay

## 2022-06-01 ENCOUNTER — Telehealth: Payer: Self-pay

## 2022-06-01 NOTE — Telephone Encounter (Signed)
Patient Advocate Encounter  Prior Authorization for [employer] Receiver has been approved.    PA# [id number] Effective dates: 05/17/2022 through 05/16/2023  Patients co-pay is $290.10

## 2022-09-01 ENCOUNTER — Encounter: Payer: Self-pay | Admitting: Internal Medicine

## 2022-09-01 ENCOUNTER — Other Ambulatory Visit: Payer: Self-pay

## 2022-09-01 DIAGNOSIS — E1129 Type 2 diabetes mellitus with other diabetic kidney complication: Secondary | ICD-10-CM

## 2022-09-01 DIAGNOSIS — E785 Hyperlipidemia, unspecified: Secondary | ICD-10-CM

## 2022-09-01 MED ORDER — HUMALOG KWIKPEN 200 UNIT/ML ~~LOC~~ SOPN: 10.0000 [IU] | PEN_INJECTOR | Freq: Three times a day (TID) | SUBCUTANEOUS | 1 refills | Status: DC

## 2022-09-01 MED ORDER — FREESTYLE LIBRE 2 SENSOR MISC: 6 refills | Status: DC

## 2022-09-01 MED ORDER — BASAGLAR KWIKPEN 100 UNIT/ML ~~LOC~~ SOPN: 40.0000 [IU] | PEN_INJECTOR | Freq: Every day | SUBCUTANEOUS | 3 refills | Status: DC

## 2022-09-01 MED ORDER — DAPAGLIFLOZIN PROPANEDIOL 10 MG PO TABS: 10.0000 mg | ORAL_TABLET | Freq: Every day | ORAL | 3 refills | Status: DC

## 2022-09-01 MED ORDER — FREESTYLE LIBRE 2 READER DEVI: 0 refills | Status: DC

## 2022-09-01 MED ORDER — ROSUVASTATIN CALCIUM 5 MG PO TABS: 5.0000 mg | ORAL_TABLET | Freq: Every day | ORAL | 3 refills | Status: DC

## 2022-10-27 ENCOUNTER — Ambulatory Visit: Payer: Managed Care, Other (non HMO) | Admitting: Internal Medicine

## 2022-10-27 NOTE — Progress Notes (Deleted)
Name: Robert [name]  Age/ Sex: 41 y.o., male   MRN/ DOB: [id number], 1981-12-05     PCP: Janith Lima, MD   Reason for Endocrinology Evaluation: Type 2 Diabetes Mellitus  Initial Endocrine Consultative Visit: 09/08/2020    PATIENT IDENTIFIER: Robert [name] is a 41 y.o. male with a past medical history of T2DM, OSA and GERD. The patient has followed with Endocrinology clinic since 09/08/2020 for consultative assistance with management of his diabetes.  DIABETIC HISTORY:  Mr. Koman was diagnosed with DM in 2015. He was initially on diet modification regimen but insulin was started 08/2020 . His hemoglobin A1c has ranged from 7.3% in 2019, peaking at 13.3% in 2021.   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 We adjusted MDI regimen and started Metformin   Farxiga started 12/2020  SUBJECTIVE:   During the last visit (04/21/2022): A1c 6.6 %     Today (10/27/2022): Robert [name] is here for a follow up on diabetes management.  He has not been checking glucose at home.      Denies nausea, vomiting, diarrhea    HOME DIABETES REGIMEN:  Basaglar 40 units daily  Humalog 10 units TIDQAC Metformin 500 mg XR daily  Farxiga 10 mg daily CF: Humalog (BG-130/25)    Statin: yes ACE-I/ARB: no     CONTINUOUS GLUCOSE MONITORING RECORD INTERPRETATION : not using      DIABETIC COMPLICATIONS: Microvascular complications:   Denies: CKD, retinopathy , neuropathy  Last Eye Exam: Completed 10/2021  Macrovascular complications:   Denies: CAD, CVA, PVD   HISTORY:  Past Medical History:  Past Medical History:  Diagnosis Date   Asthma    GERD (gastroesophageal reflux disease)    Past Surgical History: No past surgical history on file. Social History:  reports that he has never smoked. He has never used smokeless tobacco. He reports that he does not drink alcohol and does not use drugs. Family History:   Family History  Problem Relation Age of Onset   Hypertension Other    Asthma Son    Cancer Neg Hx    Diabetes Neg Hx    Early death Neg Hx    Heart disease Neg Hx    Hyperlipidemia Neg Hx    Kidney disease Neg Hx    Stroke Neg Hx      HOME MEDICATIONS: Allergies as of 10/27/2022   No Known Allergies      Medication List        Accurate as of October 27, 2022  7:27 AM. If you have any questions, ask your nurse or doctor.          Accu-Chek Guide Me w/Device Kit 1 Act by Does not apply route in the morning, at noon, and at bedtime.   alfuzosin 10 MG 24 hr tablet Commonly known as: UROXATRAL TAKE 1 TABLET DAILY WITH  BREAKFAST   aspirin EC 81 MG tablet Take 1 tablet (81 mg total) by mouth daily.   Basaglar KwikPen 100 UNIT/ML Inject 40 Units into the skin daily.   dapagliflozin propanediol 10 MG Tabs tablet Commonly known as: Farxiga Take 1 tablet (10 mg total) by mouth daily before breakfast.   fluconazole 150 MG tablet Commonly known as: Diflucan Take 1 tablet (150 mg total) by mouth daily.   FreeStyle Libre 2 Reader Kerrin Mo USE TO CHECK BLOOD  SUGAR   FreeStyle Libre 2 Sensor Misc Change every 14 days   HumaLOG KwikPen 200 UNIT/ML KwikPen Generic drug: insulin lispro Inject 10 Units into the skin with breakfast, with lunch, and with evening meal.   Insulin Pen Needle 32G X 6 MM Misc 1 Device by Does not apply route in the morning, at noon, in the evening, and at bedtime.   metFORMIN 500 MG tablet Commonly known as: GLUCOPHAGE Take 1 tablet (500 mg total) by mouth daily with breakfast.   rosuvastatin 5 MG tablet Commonly known as: CRESTOR Take 1 tablet (5 mg total) by mouth daily.   tadalafil 5 MG tablet Commonly known as: CIALIS Take 1 tablet (5 mg total) by mouth daily.         OBJECTIVE:   Vital Signs: There were no vitals taken for this visit.  Wt Readings from Last 3 Encounters:  04/21/22 230 lb (104.3 kg)  12/14/21 237 lb  (107.5 kg)  10/21/21 236 lb 9.6 oz (107.3 kg)     Exam: General: Pt appears well and is in NAD  Lungs: Clear with good BS bilat with no rales, rhonchi, or wheezes  Heart: RRR with normal S1 and S2 and no gallops; no murmurs; no rub  Abdomen: Normoactive bowel sounds, soft, nontender, without masses or organomegaly palpable  Extremities: No pretibial edema  Neuro: MS is good with appropriate affect, pt is alert and Ox3      DM foot exam: 10/21/2021     The skin of the feet is intact without sores or ulcerations. The pedal pulses are 2+ on right and 2+ on left. The sensation is intact to a screening 5.07, 10 gram monofilament bilaterally     DATA REVIEWED:  Lab Results  Component Value Date   HGBA1C 6.6 (A) 04/21/2022   HGBA1C 6.9 (A) 10/21/2021   HGBA1C 6.8 (A) 12/20/2020   Results for NOLYN, SWAB (MRN [id number]) as of 10/21/2021 17:09  Ref. Range 10/21/2021 15:06  Sodium Latest Ref Range: 135 - 145 mEq/L 141  Potassium Latest Ref Range: 3.5 - 5.1 mEq/L 3.9  Chloride Latest Ref Range: 96 - 112 mEq/L 107  CO2 Latest Ref Range: 19 - 32 mEq/L 27  Glucose Latest Ref Range: 70 - 99 mg/dL 112 (H)  BUN Latest Ref Range: 6 - 23 mg/dL 21  Creatinine Latest Ref Range: 0.40 - 1.50 mg/dL 1.45  Calcium Latest Ref Range: 8.4 - 10.5 mg/dL 9.2  GFR Latest Ref Range: >60.00 mL/min 59.68 (L)  Total CHOL/HDL Ratio Unknown 4  Cholesterol Latest Ref Range: 0 - 200 mg/dL 134  HDL Cholesterol Latest Ref Range: >39.00 mg/dL 33.80 (L)  LDL (calc) Latest Ref Range: 0 - 99 mg/dL 71  NonHDL Unknown 100.14  Triglycerides Latest Ref Range: 0.0 - 149.0 mg/dL 147.0  VLDL Latest Ref Range: 0.0 - 40.0 mg/dL 29.4   ASSESSMENT / PLAN / RECOMMENDATIONS:     1) Type 2 Diabetes Mellitus, Optimally controlled, With microalbuminuria  complications - Most recent A1c of 6.6 %. Goal A1c < 7.0 %.    -I have praised the patient on continued optimization of glucose control -He has not been able to obtain  freestyle Elenor Legato, he initially stated that the pharmacy told him they needed to talk to the provider, explained patient we did not get any prior authorization request or communication from the pharmacy.  While in the office he did check through his phone and noted to have enough refills with the cost of $  383. -I have explained to the patient that cash pay at a regular pharmacy is around $75, he was advised to contact his insurance company and question the high price, may be Dexcom would be better covered -Intolerant to higher doses of metformin - NO changes today  MEDICATIONS: -Continue Basaglar 40 units daily  - Continue Metformin 500 mg 1 tablet daily  - Continue Farxiga 10 mg, 1 tablet with Breakfast  - Continue Humalog 10 units with each meal - Correction Factor : Humalog ( BG -130/25)   EDUCATION / INSTRUCTIONS: BG monitoring instructions: Patient is instructed to check his blood sugars 3 times a day, before meals  Call [facility] if: BG persistently < 70  I reviewed the Rule of 15 for the treatment of hypoglycemia in detail with the patient. Literature supplied.    2) Diabetic complications:  Eye: Does not have known diabetic retinopathy.  Neuro/ Feet: Does not have known diabetic peripheral neuropathy. Renal: Patient does not have known baseline CKD. He is not on an ACEI/ARB at present.     3) Dyslipidemia : Patient is on rosuvastatin  since 07/2020   -LDL and TG at goal   Medication Continue rosuvastatin 5 mg daily  F/U in 6 months    Signed electronically by: Mack Guise, MD  [facility] Endocrinology  [address] [address] [place], [place] [address] Phone: [phone] FAX: [phone]   CC: Janith Lima, MD [place] [address] Phone: [phone]  Fax: [phone]  Return to Endocrinology clinic as below: Future Appointments  Date Time Provider [place]  10/27/2022   7:50 AM Zeta Bucy, Melanie Crazier, MD [facility] None

## 2023-10-30 ENCOUNTER — Encounter: Payer: Self-pay | Admitting: Internal Medicine

## 2023-10-30 ENCOUNTER — Ambulatory Visit (INDEPENDENT_AMBULATORY_CARE_PROVIDER_SITE_OTHER): Payer: Managed Care, Other (non HMO) | Admitting: Internal Medicine

## 2023-10-30 VITALS — BP 124/80 | HR 90 | Ht 76.0 in | Wt 248.0 lb

## 2023-10-30 DIAGNOSIS — R809 Proteinuria, unspecified: Secondary | ICD-10-CM | POA: Diagnosis not present

## 2023-10-30 DIAGNOSIS — E785 Hyperlipidemia, unspecified: Secondary | ICD-10-CM

## 2023-10-30 DIAGNOSIS — E1129 Type 2 diabetes mellitus with other diabetic kidney complication: Secondary | ICD-10-CM

## 2023-10-30 DIAGNOSIS — E118 Type 2 diabetes mellitus with unspecified complications: Secondary | ICD-10-CM | POA: Diagnosis not present

## 2023-10-30 DIAGNOSIS — Z794 Long term (current) use of insulin: Secondary | ICD-10-CM | POA: Diagnosis not present

## 2023-10-30 LAB — POCT GLUCOSE (DEVICE FOR HOME USE): POC Glucose: 276 mg/dL — AB (ref 70–99)

## 2023-10-30 MED ORDER — TIRZEPATIDE 5 MG/0.5ML ~~LOC~~ SOAJ: 5.0000 mg | SUBCUTANEOUS | 3 refills | Status: DC

## 2023-10-30 MED ORDER — METFORMIN HCL ER 500 MG PO TB24: 500.0000 mg | ORAL_TABLET | Freq: Every day | ORAL | 3 refills | Status: DC

## 2023-10-30 MED ORDER — FREESTYLE LIBRE 3 PLUS SENSOR MISC: 1.0000 | 11 refills | Status: DC

## 2023-10-30 MED ORDER — LANTUS SOLOSTAR 100 UNIT/ML ~~LOC~~ SOPN: 40.0000 [IU] | PEN_INJECTOR | Freq: Every day | SUBCUTANEOUS | 11 refills | Status: DC

## 2023-10-30 MED ORDER — DAPAGLIFLOZIN PROPANEDIOL 10 MG PO TABS: 10.0000 mg | ORAL_TABLET | Freq: Every day | ORAL | 3 refills | Status: DC

## 2023-10-30 MED ORDER — INSULIN PEN NEEDLE 32G X 6 MM MISC: 1.0000 | Freq: Four times a day (QID) | 3 refills | Status: DC

## 2023-10-30 MED ORDER — HUMALOG KWIKPEN 200 UNIT/ML ~~LOC~~ SOPN: PEN_INJECTOR | SUBCUTANEOUS | 3 refills | Status: DC

## 2023-10-30 NOTE — Progress Notes (Signed)
Name: Robert [name]  Age/ Sex: 42 y.o., male   MRN/ DOB: [id number], [birth year]     PCP: Etta Grandchild, MD   Reason for Endocrinology Evaluation: Type 2 Diabetes Mellitus  Initial Endocrine Consultative Visit: 09/08/2020    PATIENT IDENTIFIER: Robert [name] is a 42 y.o. male with a past medical history of T2DM, OSA and GERD. The patient has followed with Endocrinology clinic since 09/08/2020 for consultative assistance with management of his diabetes.  DIABETIC HISTORY:  Mr. Debroux was diagnosed with DM in 2015. He was initially on diet modification regimen but insulin was started 08/2020 . His hemoglobin A1c has ranged from 7.3% in 2019, peaking at 13.3% in 2021.   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 We adjusted MDI regimen and started Metformin   Farxiga started 12/2020  SUBJECTIVE:   During the last visit (04/21/2022): A1c 6.6%     Today (10/30/2023): Robert [name] is here for a follow up on diabetes management.  He has NOT been to our clinic in 18 months. He has not been checking glucose at home.       He has not been taking Hospital doctor as discussed prohibitive Has not been using freestyle libre Has not been consistent with farxiga intake   Denies nausea, vomiting, Denies constipation or  diarrhea    HOME DIABETES REGIMEN:  Basaglar 40 units daily -not taking Humalog 10 units TIDQAC-taking 20 units with each meal Metformin 500 mg XR daily  Farxiga 10 mg daily CF: Humalog (BG-130/25)    Statin: yes ACE-I/ARB: no     CONTINUOUS GLUCOSE MONITORING RECORD INTERPRETATION : not using      DIABETIC COMPLICATIONS: Microvascular complications:   Denies: CKD, retinopathy , neuropathy  Last Eye Exam: Completed 10/2021  Macrovascular complications:   Denies: CAD, CVA, PVD   HISTORY:  Past Medical History:  Past Medical History:  Diagnosis Date   Asthma    GERD (gastroesophageal  reflux disease)    Past Surgical History: No past surgical history on file. Social History:  reports that he has never smoked. He has never used smokeless tobacco. He reports that he does not drink alcohol and does not use drugs. Family History:  Family History  Problem Relation Age of Onset   Hypertension Other    Asthma Son    Cancer Neg Hx    Diabetes Neg Hx    Early death Neg Hx    Heart disease Neg Hx    Hyperlipidemia Neg Hx    Kidney disease Neg Hx    Stroke Neg Hx      HOME MEDICATIONS: Allergies as of 10/30/2023   No Known Allergies      Medication List        Accurate as of October 30, 2023 10:56 AM. If you have any questions, ask your nurse or doctor.          Accu-Chek Guide Me w/Device Kit 1 Act by Does not apply route in the morning, at noon, and at bedtime.   alfuzosin 10 MG 24 hr tablet Commonly known as: UROXATRAL TAKE 1 TABLET DAILY WITH  BREAKFAST   aspirin EC 81 MG tablet Take 1 tablet (81 mg total) by mouth daily.   Basaglar KwikPen 100 UNIT/ML Inject 40 Units into the skin daily.   dapagliflozin propanediol 10 MG Tabs tablet Commonly known  asMarcelline Deist Take 1 tablet (10 mg total) by mouth daily before breakfast.   fluconazole 150 MG tablet Commonly known as: Diflucan Take 1 tablet (150 mg total) by mouth daily.   FreeStyle Libre 2 Reader Hardie Pulley USE TO CHECK BLOOD SUGAR   FreeStyle Libre 2 Sensor Misc Change every 14 days   HumaLOG KwikPen 200 UNIT/ML KwikPen Generic drug: insulin lispro Inject 10 Units into the skin with breakfast, with lunch, and with evening meal. What changed: how much to take   Insulin Pen Needle 32G X 6 MM Misc 1 Device by Does not apply route in the morning, at noon, in the evening, and at bedtime.   metFORMIN 500 MG tablet Commonly known as: GLUCOPHAGE Take 1 tablet (500 mg total) by mouth daily with breakfast.   rosuvastatin 5 MG tablet Commonly known as: CRESTOR Take 1 tablet (5 mg total) by  mouth daily.   tadalafil 5 MG tablet Commonly known as: CIALIS Take 1 tablet (5 mg total) by mouth daily.         OBJECTIVE:   Vital Signs: BP 124/80 (BP Location: Left Arm, Patient Position: Sitting, Cuff Size: Large)   Pulse 90   Ht 6\' 4"  (1.93 m)   Wt 248 lb (112.5 kg)   SpO2 96%   BMI 30.19 kg/m   Wt Readings from Last 3 Encounters:  10/30/23 248 lb (112.5 kg)  04/21/22 230 lb (104.3 kg)  12/14/21 237 lb (107.5 kg)     Exam: General: Pt appears well and is in NAD  Lungs: Clear with good BS bilat   Heart: RRR  Abdomen: Soft, nontender  Extremities: No pretibial edema  Neuro: MS is good with appropriate affect, pt is alert and Ox3      DM foot exam: 10/30/2023     The skin of the feet is intact without sores or ulcerations. The pedal pulses are 2+ on right and 2+ on left. The sensation is intact to a screening 5.07, 10 gram monofilament bilaterally     DATA REVIEWED:  Lab Results  Component Value Date   HGBA1C 6.6 (A) 04/21/2022   HGBA1C 6.9 (A) 10/21/2021   HGBA1C 6.8 (A) 12/20/2020    10/18/2023 A1c 11.7% BUN 17 CR 1.36 GFR 66 TG 113 HDL 48 LDL 140  ASSESSMENT / PLAN / RECOMMENDATIONS:     1) Type 2 Diabetes Mellitus,Poorly controlled, With microalbuminuria  complications - Most recent A1c of 11.7 %. Goal A1c < 7.0 %.    -Patient has not been on basal insulin due to cost issues -I will try to prescribe Lantus, previously Basaglar was around $800.  Patient was told by the pharmacy that he has a deductible to meet, but at the same time his insurance was paying for Humalog which does not make sense to me.  I suspect the Basaglar was not on the formulary and it just needed to be switched to another basal insulin -Patient would like a prescription for freestyle libre, he was given #2 sensors of freestyle libre 2 -I will decrease his Humalog -I have recommended starting Mounjaro, caution against GI side effects, he was provided with #4 pens of  Mounjaro 2.5 mg -Patient has not been consistent with [place] intake due to urinary frequency, I have asked him to take half a tablet and see how he does with that  MEDICATIONS: -Restart Lantus 40 units daily  -Continue Metformin 500 mg 1 tablet daily  -Decrease Farxiga 10 mg, half tablet with Breakfast  -  Decrease Humalog 14 units with each meal - Correction Factor : Humalog ( BG -130/25)  -Start Mounjaro 2.5 mg weekly for 4 weeks, then increase to 5 mg weekly  EDUCATION / INSTRUCTIONS: BG monitoring instructions: Patient is instructed to check his blood sugars 3 times a day, before meals  Call [place] [facility] clinic if: BG persistently < 70  I reviewed the Rule of 15 for the treatment of hypoglycemia in detail with the patient. Literature supplied.    2) Diabetic complications:  Eye: Does not have known diabetic retinopathy.  Neuro/ Feet: Does not have known diabetic peripheral neuropathy. Renal: Patient does not have known baseline CKD. He is not on an ACEI/ARB at present.     3) Dyslipidemia : Patient is on rosuvastatin  since 07/2020   -Historically his TG and LDL have been at goal, but his most recent labs show elevated LDL -Will continue to monitor   Medication Continue rosuvastatin 5 mg daily  F/U in 3 months    Signed electronically by: Lyndle Herrlich, MD  [facility] Endocrinology  [facility] [address] [place] [place], [place] [address] Phone: [phone] FAX: [phone]   CC: Etta Grandchild, MD [address] [place] [address] Phone: [phone]  Fax: [phone]  Return to Endocrinology clinic as below: Future Appointments  Date Time Provider Department Center  10/30/2023 11:10 AM Cameo Schmiesing, Konrad Dolores, MD [facility] None

## 2023-10-30 NOTE — Patient Instructions (Signed)
Start Mounjaro 2.5 mg once weekly for a month, than increase to 5 mg weekly Continue Metformin 500 mg XR, 1 tablet daily Take Farxiga 10 mg, half a tablet daily Start Lantus 40 units once daily Take Humalog 14 units with each meal -Humalog correctional insulin: Use the scale below to help guide you:   Blood sugar before meal Number of units to inject  Less than 155 0 unit  156 -  180 1 units  181 -  205 2 units  206 -  230 3 units  231 -  255 4 units  256 -  280 5 units  281 -  305 6 units      HOW TO TREAT LOW BLOOD SUGARS (Blood sugar LESS THAN 70 MG/DL) Please follow the RULE OF 15 for the treatment of hypoglycemia treatment (when your (blood sugars are less than 70 mg/dL)   STEP 1: Take 15 grams of carbohydrates when your blood sugar is low, which includes:  3-4 GLUCOSE TABS  OR 3-4 OZ OF JUICE OR REGULAR SODA OR ONE TUBE OF GLUCOSE GEL    STEP 2: RECHECK blood sugar in 15 MINUTES STEP 3: If your blood sugar is still low at the 15 minute recheck --> then, go back to STEP 1 and treat AGAIN with another 15 grams of carbohydrates.

## 2023-11-01 ENCOUNTER — Other Ambulatory Visit: Payer: Self-pay

## 2023-11-01 DIAGNOSIS — E1129 Type 2 diabetes mellitus with other diabetic kidney complication: Secondary | ICD-10-CM

## 2023-11-01 MED ORDER — HUMALOG KWIKPEN 200 UNIT/ML ~~LOC~~ SOPN: PEN_INJECTOR | SUBCUTANEOUS | 3 refills | Status: DC

## 2024-01-16 ENCOUNTER — Other Ambulatory Visit: Payer: Self-pay

## 2024-01-16 MED ORDER — LANTUS SOLOSTAR 100 UNIT/ML ~~LOC~~ SOPN: 40.0000 [IU] | PEN_INJECTOR | Freq: Every day | SUBCUTANEOUS | 11 refills | Status: DC

## 2024-01-16 MED ORDER — METFORMIN HCL ER 500 MG PO TB24: 500.0000 mg | ORAL_TABLET | Freq: Every day | ORAL | 3 refills | Status: DC

## 2024-01-18 ENCOUNTER — Other Ambulatory Visit: Payer: Self-pay

## 2024-01-18 MED ORDER — TIRZEPATIDE 5 MG/0.5ML ~~LOC~~ SOAJ
5.0000 mg | SUBCUTANEOUS | 3 refills | Status: DC
Start: 2024-01-18 — End: 2024-02-01

## 2024-02-01 ENCOUNTER — Encounter: Payer: Self-pay | Admitting: Internal Medicine

## 2024-02-01 ENCOUNTER — Ambulatory Visit: Payer: 59 | Admitting: Internal Medicine

## 2024-02-01 VITALS — BP 124/76 | HR 100 | Ht 76.0 in | Wt 247.0 lb

## 2024-02-01 DIAGNOSIS — R809 Proteinuria, unspecified: Secondary | ICD-10-CM | POA: Diagnosis not present

## 2024-02-01 DIAGNOSIS — E1129 Type 2 diabetes mellitus with other diabetic kidney complication: Secondary | ICD-10-CM

## 2024-02-01 DIAGNOSIS — Z794 Long term (current) use of insulin: Secondary | ICD-10-CM

## 2024-02-01 DIAGNOSIS — E118 Type 2 diabetes mellitus with unspecified complications: Secondary | ICD-10-CM | POA: Diagnosis not present

## 2024-02-01 DIAGNOSIS — E785 Hyperlipidemia, unspecified: Secondary | ICD-10-CM | POA: Diagnosis not present

## 2024-02-01 LAB — POCT GLYCOSYLATED HEMOGLOBIN (HGB A1C): Hemoglobin A1C: 6 % — AB (ref 4.0–5.6)

## 2024-02-01 MED ORDER — INSULIN PEN NEEDLE 32G X 6 MM MISC: 1.0000 | Freq: Four times a day (QID) | 3 refills | Status: DC

## 2024-02-01 MED ORDER — METFORMIN HCL ER 500 MG PO TB24: 500.0000 mg | ORAL_TABLET | Freq: Every day | ORAL | 3 refills | Status: DC

## 2024-02-01 MED ORDER — TIRZEPATIDE 7.5 MG/0.5ML ~~LOC~~ SOAJ: 7.5000 mg | SUBCUTANEOUS | 3 refills | Status: DC

## 2024-02-01 MED ORDER — LANTUS SOLOSTAR 100 UNIT/ML ~~LOC~~ SOPN: 30.0000 [IU] | PEN_INJECTOR | Freq: Every day | SUBCUTANEOUS | 3 refills | Status: DC

## 2024-02-01 MED ORDER — DAPAGLIFLOZIN PROPANEDIOL 5 MG PO TABS: 5.0000 mg | ORAL_TABLET | Freq: Every day | ORAL | 3 refills | Status: DC

## 2024-02-01 MED ORDER — ROSUVASTATIN CALCIUM 5 MG PO TABS: 5.0000 mg | ORAL_TABLET | Freq: Every day | ORAL | 3 refills | Status: DC

## 2024-02-01 NOTE — Patient Instructions (Addendum)
Increase  Mounjaro 7.5  mg weekly Continue Metformin 500 mg XR, 1 tablet daily Change Farxiga 5 mg, 1 tablet daily Decrease Lantus 30 units once daily      HOW TO TREAT LOW BLOOD SUGARS (Blood sugar LESS THAN 70 MG/DL) Please follow the RULE OF 15 for the treatment of hypoglycemia treatment (when your (blood sugars are less than 70 mg/dL)   STEP 1: Take 15 grams of carbohydrates when your blood sugar is low, which includes:  3-4 GLUCOSE TABS  OR 3-4 OZ OF JUICE OR REGULAR SODA OR ONE TUBE OF GLUCOSE GEL    STEP 2: RECHECK blood sugar in 15 MINUTES STEP 3: If your blood sugar is still low at the 15 minute recheck --> then, go back to STEP 1 and treat AGAIN with another 15 grams of carbohydrates.

## 2024-02-01 NOTE — Progress Notes (Signed)
 Name: Robert [name]  Age/ Sex: 43 y.o., male   MRN/ DOB: [id number], [birth year]     PCP: Etta Grandchild, MD   Reason for Endocrinology Evaluation: Type 2 Diabetes Mellitus  Initial Endocrine Consultative Visit: 09/08/2020    PATIENT IDENTIFIER: Robert [name] is a 43 y.o. male with a past medical history of T2DM, OSA and GERD. The patient has followed with Endocrinology clinic since 09/08/2020 for consultative assistance with management of his diabetes.  DIABETIC HISTORY:  Mr. Nosbisch was diagnosed with DM in 2015. He was initially on diet modification regimen but insulin was started 08/2020 . His hemoglobin A1c has ranged from 7.3% in 2019, peaking at 13.3% in 2021.   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 We adjusted MDI regimen and started Metformin   Farxiga started 12/2020, dose was decreased by 10/2023 due to urinary frequency  Started Mounjaro 10/2023  SUBJECTIVE:   During the last visit (10/30/2023): A1c 11.4%     Today (02/01/2024): Mr. Ace is here for a follow up on diabetes management.  He has been checking glucose multiple times a day. Pt is    Denies nausea, vomiting, Denies constipation or diarrhea    HOME DIABETES REGIMEN:  Metformin 500 mg, 1 tablet daily Farxiga 10 mg, half a tablet daily- not taking  Mounjaro 5 mg weekly Lantus 40 units daily  Humalog 14 units TIDQAC- not taking  CF: Humalog (BG-130/25)    Statin: yes ACE-I/ARB: no   CONTINUOUS GLUCOSE MONITORING RECORD INTERPRETATION    Dates of Recording: 2/1-2/14/2025  Sensor description:freestyle libre  Results statistics:   CGM use % of time 97  Average and SD 126/23.6  Time in range   93     %  % Time Above 180 5  % Time above 250 0  % Time Below target 2   Glycemic patterns summary: BG's optimal through the day and night   Hyperglycemic episodes  postprandial   Hypoglycemic episodes occurred  during the day and night   Overnight periods: during the day    DIABETIC COMPLICATIONS: Microvascular complications:   Denies: CKD, retinopathy , neuropathy  Last Eye Exam: Completed 10/2021  Macrovascular complications:   Denies: CAD, CVA, PVD   HISTORY:  Past Medical History:  Past Medical History:  Diagnosis Date   Asthma    GERD (gastroesophageal reflux disease)    Past Surgical History: No past surgical history on file. Social History:  reports that he has never smoked. He has never used smokeless tobacco. He reports that he does not drink alcohol and does not use drugs. Family History:  Family History  Problem Relation Age of Onset   Hypertension Other    Asthma Son    Cancer Neg Hx    Diabetes Neg Hx    Early death Neg Hx    Heart disease Neg Hx    Hyperlipidemia Neg Hx    Kidney disease Neg Hx    Stroke Neg Hx      HOME MEDICATIONS: Allergies as of 02/01/2024   No Known Allergies      Medication List        Accurate as of February 01, 2024 12:10 PM. If you have any questions, ask your nurse or doctor.          Accu-Chek Guide Me w/Device Kit 1 Act by  Does not apply route in the morning, at noon, and at bedtime.   alfuzosin 10 MG 24 hr tablet Commonly known as: UROXATRAL TAKE 1 TABLET DAILY WITH  BREAKFAST   aspirin EC 81 MG tablet Take 1 tablet (81 mg total) by mouth daily.   dapagliflozin propanediol 10 MG Tabs tablet Commonly known as: Farxiga Take 1 tablet (10 mg total) by mouth daily before breakfast.   fluconazole 150 MG tablet Commonly known as: Diflucan Take 1 tablet (150 mg total) by mouth daily.   FreeStyle Libre 3 Plus Sensor Misc 1 Device by Other route every 14 (fourteen) days. Change sensor every 15 days.   HumaLOG KwikPen 200 UNIT/ML KwikPen Generic drug: insulin lispro Take Humalog 14 units with each meal -Humalog correctional insulin: Use the scale below to help guide you:  Blood sugar before meal Number of units  to inject Less than 155 0 unit 156 -  180 1 units 181 -  205 2 units 206 -  230 3 units 231 -  255 4 units 256 -  280 5 units 281 -  305 6 units  Max daily 60 units   Insulin Pen Needle 32G X 6 MM Misc 1 Device by Does not apply route in the morning, at noon, in the evening, and at bedtime.   Lantus SoloStar 100 UNIT/ML Solostar Pen Generic drug: insulin glargine Inject 40 Units into the skin daily.   metFORMIN 500 MG 24 hr tablet Commonly known as: GLUCOPHAGE-XR Take 1 tablet (500 mg total) by mouth daily with breakfast.   rosuvastatin 5 MG tablet Commonly known as: CRESTOR Take 1 tablet (5 mg total) by mouth daily.   tadalafil 5 MG tablet Commonly known as: CIALIS Take 1 tablet (5 mg total) by mouth daily.   tirzepatide 5 MG/0.5ML Pen Commonly known as: MOUNJARO Inject 5 mg into the skin once a week.         OBJECTIVE:   Vital Signs: BP 124/76 (BP Location: Left Arm, Patient Position: Sitting, Cuff Size: Normal)   Pulse 100   Ht 6\' 4"  (1.93 m)   Wt 247 lb (112 kg)   SpO2 98%   BMI 30.07 kg/m   Wt Readings from Last 3 Encounters:  02/01/24 247 lb (112 kg)  10/30/23 248 lb (112.5 kg)  04/21/22 230 lb (104.3 kg)     Exam: General: Pt appears well and is in NAD  Lungs: Clear with good BS bilat   Heart: RRR  Abdomen: Soft, nontender  Extremities: No pretibial edema  Neuro: MS is good with appropriate affect, pt is alert and Ox3      DM foot exam: 10/30/2023     The skin of the feet is intact without sores or ulcerations. The pedal pulses are 2+ on right and 2+ on left. The sensation is intact to a screening 5.07, 10 gram monofilament bilaterally     DATA REVIEWED:  Lab Results  Component Value Date   HGBA1C 6.0 (A) 02/01/2024   HGBA1C 6.6 (A) 04/21/2022   HGBA1C 6.9 (A) 10/21/2021    10/18/2023 A1c 11.7% BUN 17 CR 1.36 GFR 66 TG 113 HDL 48 LDL 140  ASSESSMENT / PLAN / RECOMMENDATIONS:     1) Type 2 Diabetes Mellitus, Optimally   controlled, With microalbuminuria  complications - Most recent A1c of 6.0 %. Goal A1c < 7.0 %.   -I have praised the patient on optimizing glucose control -He has not been using Humalog due to optimal BG's,  will discontinue -He continues to hold [place] due to urinary frequency, he has been off of it for 2 weeks and did not notice a difference, I did advise the patient to go back on 5 mg as long as there is no difference in urinary frequency, we did discuss the cardiovascular renal benefits of this class -I will decrease Lantus to prevent hypoglycemia -I will increase Mounjaro as below    MEDICATIONS: -Continue Metformin 500 mg 1 tablet daily  -Change Farxiga 5 mg, 1 tablet with Breakfast  -Increase Mounjaro 7.5 mg weekly - Decrease Lantus 30 units daily  - Stop Humalog   EDUCATION / INSTRUCTIONS: BG monitoring instructions: Patient is instructed to check his blood sugars 3 times a day, before meals  Call [facility] if: BG persistently < 70  I reviewed the Rule of 15 for the treatment of hypoglycemia in detail with the patient. Literature supplied.    2) Diabetic complications:  Eye: Does not have known diabetic retinopathy.  Neuro/ Feet: Does not have known diabetic peripheral neuropathy. Renal: Patient does not have known baseline CKD. He is not on an ACEI/ARB at present.     3) Dyslipidemia : Patient is on rosuvastatin  since 07/2020    -Will continue to monitor   Medication Continue rosuvastatin 5 mg daily  F/U in 4 months    Signed electronically by: Lyndle Herrlich, MD  [facility] Endocrinology  [facility] [address] [place] [place], [place] [address] Phone: [phone] FAX: [phone]   CC: Etta Grandchild, MD [address] [place] [address] Phone: [phone]  Fax: [phone]  Return to Endocrinology clinic as below: No future appointments.

## 2024-03-04 ENCOUNTER — Other Ambulatory Visit (HOSPITAL_COMMUNITY): Payer: Self-pay

## 2024-05-07 LAB — HM DIABETES EYE EXAM

## 2024-05-26 ENCOUNTER — Encounter: Payer: Self-pay | Admitting: Internal Medicine

## 2024-06-02 ENCOUNTER — Ambulatory Visit: Payer: 59 | Admitting: Internal Medicine

## 2024-06-09 ENCOUNTER — Ambulatory Visit: Admitting: Internal Medicine

## 2024-06-09 ENCOUNTER — Encounter: Payer: Self-pay | Admitting: Internal Medicine

## 2024-06-09 VITALS — BP 122/70 | HR 63 | Ht 76.0 in | Wt 224.0 lb

## 2024-06-09 DIAGNOSIS — E785 Hyperlipidemia, unspecified: Secondary | ICD-10-CM | POA: Diagnosis not present

## 2024-06-09 DIAGNOSIS — R809 Proteinuria, unspecified: Secondary | ICD-10-CM | POA: Diagnosis not present

## 2024-06-09 DIAGNOSIS — E1129 Type 2 diabetes mellitus with other diabetic kidney complication: Secondary | ICD-10-CM

## 2024-06-09 DIAGNOSIS — Z794 Long term (current) use of insulin: Secondary | ICD-10-CM

## 2024-06-09 LAB — POCT GLYCOSYLATED HEMOGLOBIN (HGB A1C): Hemoglobin A1C: 6.1 % — AB (ref 4.0–5.6)

## 2024-06-09 MED ORDER — TIRZEPATIDE 7.5 MG/0.5ML ~~LOC~~ SOAJ: 7.5000 mg | SUBCUTANEOUS | 3 refills | Status: DC

## 2024-06-09 MED ORDER — DAPAGLIFLOZIN PROPANEDIOL 5 MG PO TABS: 5.0000 mg | ORAL_TABLET | Freq: Every day | ORAL | 3 refills | Status: DC

## 2024-06-09 MED ORDER — FREESTYLE LIBRE 3 PLUS SENSOR MISC: 1.0000 | 11 refills | Status: DC

## 2024-06-09 NOTE — Patient Instructions (Addendum)
 Continue   Mounjaro  7.5  mg weekly Continue Farxiga  5 mg, 1 tablet daily Stop Metformin   Stop Lantus        HOW TO TREAT LOW BLOOD SUGARS (Blood sugar LESS THAN 70 MG/DL) Please follow the RULE OF 15 for the treatment of hypoglycemia treatment (when your (blood sugars are less than 70 mg/dL)   STEP 1: Take 15 grams of carbohydrates when your blood sugar is low, which includes:  3-4 GLUCOSE TABS  OR 3-4 OZ OF JUICE OR REGULAR SODA OR ONE TUBE OF GLUCOSE GEL    STEP 2: RECHECK blood sugar in 15 MINUTES STEP 3: If your blood sugar is still low at the 15 minute recheck --> then, go back to STEP 1 and treat AGAIN with another 15 grams of carbohydrates.

## 2024-06-09 NOTE — Progress Notes (Unsigned)
 Name: Robert [name]  Age/ Sex: 43 y.o., male   MRN/ DOB: [id number], [birth year]     PCP: Joshua Debby CROME, MD   Reason for Endocrinology Evaluation: Type 2 Diabetes Mellitus  Initial Endocrine Consultative Visit: 09/08/2020    PATIENT IDENTIFIER: [name] is a 43 y.o. male with a past medical history of T2DM, OSA and GERD. The patient has followed with Endocrinology clinic since 09/08/2020 for consultative assistance with management of his diabetes.  DIABETIC HISTORY:  Robert [name] was diagnosed with DM in 2015. He was initially on diet modification regimen but insulin  was started 08/2020 . His hemoglobin A1c has ranged from 7.3% in 2019, peaking at 13.3% in 2021.   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 We adjusted MDI regimen and started Metformin    Farxiga  started 12/2020, dose was decreased by 10/2023 due to urinary frequency  Started Mounjaro  10/2023  We discontinued Humalog  01/2024 with an A1c of 6.0%   We discontinued metformin  05/2024 due to GI side effects to 1 tablet today I took Lantus  off his medication list 05/2024 as he was not taking it due to hypoglycemia  SUBJECTIVE:   During the last visit (02/01/2024): A1c 6.0%     Today (06/09/2024): Robert [name] is here for a follow up on diabetes management.  He has been checking glucose multiple times a day through freestyle libre. Pt has not been noted with any hypoglycemic episodes over the past 2 weeks.  He has not been consistent with insulin  or metformin  intake Patient continues with weight loss Denies nausea, vomiting, Denies constipation or diarrhea    HOME DIABETES REGIMEN:  Metformin  500 mg, 1 tablet daily Farxiga  5 mg, daily  Mounjaro  7.5 mg weekly Lantus  20 units daily      Statin: yes ACE-I/ARB: no   CONTINUOUS GLUCOSE MONITORING RECORD INTERPRETATION    Dates of Recording: 6/10 - 06/09/2024  Sensor  description:freestyle libre  Results statistics:   CGM use % of time 89  Average and SD 126/16.4  Time in range 99%  % Time Above 180 1  % Time above 250 0  % Time Below target 0   Glycemic patterns summary: BG's optimal through the day and night   Hyperglycemic episodes rarely postprandial  Hypoglycemic episodes occurred N/A  Overnight periods: Optimal    DIABETIC COMPLICATIONS: Microvascular complications:   Denies: CKD, retinopathy , neuropathy  Last Eye Exam: Completed 10/2021  Macrovascular complications:   Denies: CAD, CVA, PVD   HISTORY:  Past Medical History:  Past Medical History:  Diagnosis Date   Asthma    GERD (gastroesophageal reflux disease)    Past Surgical History: No past surgical history on file. Social History:  reports that he has never smoked. He has never used smokeless tobacco. He reports that he does not drink alcohol and does not use drugs. Family History:  Family History  Problem Relation Age of Onset   Hypertension Other    Asthma Son    Cancer Neg Hx    Diabetes Neg Hx    Early death Neg Hx    Heart disease Neg Hx    Hyperlipidemia Neg Hx    Kidney disease Neg Hx    Stroke Neg Hx      HOME MEDICATIONS: Allergies as of 06/09/2024   No Known Allergies      Medication  List        Accurate as of June 09, 2024  8:05 AM. If you have any questions, ask your nurse or doctor.          Accu-Chek Guide Me w/Device Kit 1 Act by Does not apply route in the morning, at noon, and at bedtime.   alfuzosin  10 MG 24 hr tablet Commonly known as: UROXATRAL  TAKE 1 TABLET DAILY WITH  BREAKFAST   aspirin  EC 81 MG tablet Take 1 tablet (81 mg total) by mouth daily.   dapagliflozin  propanediol 5 MG Tabs tablet Commonly known as: Farxiga  Take 1 tablet (5 mg total) by mouth daily before breakfast.   FreeStyle Libre 3 Plus Sensor Misc 1 Device by Other route every 14 (fourteen) days. Change sensor every 15 days.   Insulin  Pen  Needle 32G X 6 MM Misc 1 Device by Does not apply route in the morning, at noon, in the evening, and at bedtime.   Lantus  SoloStar 100 UNIT/ML Solostar Pen Generic drug: insulin  glargine Inject 30 Units into the skin daily. What changed: additional instructions   metFORMIN  500 MG 24 hr tablet Commonly known as: GLUCOPHAGE -XR Take 1 tablet (500 mg total) by mouth daily with breakfast. What changed: additional instructions   rosuvastatin  5 MG tablet Commonly known as: CRESTOR  Take 1 tablet (5 mg total) by mouth daily.   tadalafil  5 MG tablet Commonly known as: CIALIS  Take 1 tablet (5 mg total) by mouth daily.   tirzepatide  7.5 MG/0.5ML Pen Commonly known as: MOUNJARO  Inject 7.5 mg into the skin once a week.         OBJECTIVE:   Vital Signs: BP 122/70 (BP Location: Left Arm, Patient Position: Sitting, Cuff Size: Normal)   Pulse 63   Ht 6' 4 (1.93 m)   Wt 224 lb (101.6 kg)   SpO2 98%   BMI 27.27 kg/m   Wt Readings from Last 3 Encounters:  06/09/24 224 lb (101.6 kg)  02/01/24 247 lb (112 kg)  10/30/23 248 lb (112.5 kg)     Exam: General: Pt appears well and is in NAD  Lungs: Clear with good BS bilat   Heart: RRR  Abdomen: Soft, nontender  Extremities: No pretibial edema  Neuro: MS is good with appropriate affect, pt is alert and Ox3      DM foot exam: 10/30/2023     The skin of the feet is intact without sores or ulcerations. The pedal pulses are 2+ on right and 2+ on left. The sensation is intact to a screening 5.07, 10 gram monofilament bilaterally     DATA REVIEWED:  Lab Results  Component Value Date   HGBA1C 6.1 (A) 06/09/2024   HGBA1C 6.0 (A) 02/01/2024   HGBA1C 6.6 (A) 04/21/2022    10/18/2023 A1c 11.7% BUN 17 CR 1.36 GFR 66 TG 113 HDL 48 LDL 140  ASSESSMENT / PLAN / RECOMMENDATIONS:     1) Type 2 Diabetes Mellitus, Optimally  controlled, With microalbuminuria  complications - Most recent A1c of 6.1 %. Goal A1c < 7.0 %.   -A1c  continues to be optimal - Intolerant to higher doses of Farxiga  - Unable to tolerate 1 tablet of metformin  due to GI side effects - He self discontinued Lantus  due to hypoglycemia, will remove from his medication list - Patient to continue to monitor glucose with freestyle libre, patient encouraged to contact our office if fasting BG's trend above 150 Mg/DL consistently, we may consider restarting Lantus  at 10 units  a day   MEDICATIONS: -Continue Farxiga  5 mg, 1 tablet with Breakfast  -Continue Mounjaro  7.5 mg weekly -Stop Lantus  30 units daily  - Stop metformin   EDUCATION / INSTRUCTIONS: BG monitoring instructions: Patient is instructed to check his blood sugars 3 times a day, before meals  Call [facility] if: BG persistently < 70  I reviewed the Rule of 15 for the treatment of hypoglycemia in detail with the patient. Literature supplied.    2) Diabetic complications:  Eye: Does not have known diabetic retinopathy.  Neuro/ Feet: Does not have known diabetic peripheral neuropathy. Renal: Patient does not have known baseline CKD. He is not on an ACEI/ARB at present.     3) Dyslipidemia : Patient is on rosuvastatin   since 07/2020    -Will continue to monitor   Medication Continue rosuvastatin  5 mg daily  F/U in 4 months    Signed electronically by: Stefano Redgie Butts, MD  [facility]  [facility] [address] [place] [place], [place] [address] Phone: [phone] FAX: [phone]   CC: Joshua Debby CROME, MD [address] [place] [address] Phone: [phone]  Fax: [phone]  Return to Endocrinology clinic as below: No future appointments.

## 2024-06-10 ENCOUNTER — Encounter: Payer: Self-pay | Admitting: Internal Medicine

## 2024-06-10 ENCOUNTER — Other Ambulatory Visit

## 2024-06-18 ENCOUNTER — Telehealth: Payer: Self-pay

## 2024-06-18 NOTE — Telephone Encounter (Signed)
 Pharmacy Patient Advocate Encounter   Received notification from CoverMyMeds that prior authorization for Farxiga  5mg  is required/requested.   Insurance verification completed.   The patient is insured through [facility] .   Per test claim: PA required; PA submitted to above mentioned insurance via CoverMyMeds Key/confirmation #/EOC [id number] Status is pending

## 2024-06-23 NOTE — Telephone Encounter (Signed)
 Pharmacy Patient Advocate Encounter  Received notification from [employer] that Prior Authorization for Farxiga  5mg  has been DENIED.  Full denial letter will be uploaded to the media tab. See denial reason below.   PA #/Case ID/Reference #: [id number]

## 2024-06-24 MED ORDER — EMPAGLIFLOZIN 10 MG PO TABS: 10.0000 mg | ORAL_TABLET | Freq: Every day | ORAL | 3 refills | Status: DC

## 2024-06-24 NOTE — Addendum Note (Signed)
 Addended by: SAM DONELL PARAS on: 06/24/2024 08:46 AM   Modules accepted: Orders

## 2024-07-17 ENCOUNTER — Ambulatory Visit: Admitting: Internal Medicine

## 2024-10-10 ENCOUNTER — Ambulatory Visit: Admitting: Internal Medicine

## 2024-10-10 ENCOUNTER — Encounter: Payer: Self-pay | Admitting: Internal Medicine

## 2024-10-10 ENCOUNTER — Other Ambulatory Visit

## 2024-10-10 VITALS — BP 110/80 | HR 89 | Ht 76.0 in | Wt 219.0 lb

## 2024-10-10 DIAGNOSIS — Z794 Long term (current) use of insulin: Secondary | ICD-10-CM

## 2024-10-10 DIAGNOSIS — R809 Proteinuria, unspecified: Secondary | ICD-10-CM

## 2024-10-10 DIAGNOSIS — E785 Hyperlipidemia, unspecified: Secondary | ICD-10-CM

## 2024-10-10 DIAGNOSIS — E1129 Type 2 diabetes mellitus with other diabetic kidney complication: Secondary | ICD-10-CM

## 2024-10-10 LAB — POCT GLYCOSYLATED HEMOGLOBIN (HGB A1C): Hemoglobin A1C: 5.7 % — AB (ref 4.0–5.6)

## 2024-10-10 MED ORDER — EMPAGLIFLOZIN 10 MG PO TABS: 10.0000 mg | ORAL_TABLET | Freq: Every day | ORAL | 3 refills | Status: DC

## 2024-10-10 MED ORDER — TIRZEPATIDE 5 MG/0.5ML ~~LOC~~ SOAJ: 5.0000 mg | SUBCUTANEOUS | 3 refills | Status: DC

## 2024-10-10 NOTE — Patient Instructions (Addendum)
 Decrease Mounjaro  5 mg weekly Continue Jardiance  10  mg, 1 tablet daily      HOW TO TREAT LOW BLOOD SUGARS (Blood sugar LESS THAN 70 MG/DL) Please follow the RULE OF 15 for the treatment of hypoglycemia treatment (when your (blood sugars are less than 70 mg/dL)   STEP 1: Take 15 grams of carbohydrates when your blood sugar is low, which includes:  3-4 GLUCOSE TABS  OR 3-4 OZ OF JUICE OR REGULAR SODA OR ONE TUBE OF GLUCOSE GEL    STEP 2: RECHECK blood sugar in 15 MINUTES STEP 3: If your blood sugar is still low at the 15 minute recheck --> then, go back to STEP 1 and treat AGAIN with another 15 grams of carbohydrates.

## 2024-10-10 NOTE — Progress Notes (Signed)
 Name: Robert [name]  Age/ Sex: 43 y.o., male   MRN/ DOB: [id number], [birth year]     PCP: Robert Debby CROME, MD   Reason for Endocrinology Evaluation: Type 2 Diabetes Mellitus  Initial Endocrine Consultative Visit: 09/08/2020    PATIENT IDENTIFIER: Robert [name] is a 43 y.o. male with a past medical history of T2DM, OSA and GERD. The patient has followed with Endocrinology clinic since 09/08/2020 for consultative assistance with management of his diabetes.  DIABETIC HISTORY:  Robert [name] was diagnosed with DM in 2015. He was initially on diet modification regimen but insulin  was started 08/2020 . His hemoglobin A1c has ranged from 7.3% in 2019, peaking at 13.3% in 2021.   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 We adjusted MDI regimen and started Metformin    Farxiga  started 12/2020, dose was decreased by 10/2023 due to urinary frequency  Started Mounjaro  10/2023  We discontinued Humalog  01/2024 with an A1c of 6.0%   We discontinued metformin  05/2024 due to GI side effects to 1 tablet today I took Lantus  off his medication list 05/2024 as he was not taking it due to hypoglycemia   Switch Farxiga  to Jardiance  in July, 2025 due to insurance requirement    SUBJECTIVE:   During the last visit (06/09/2024): A1c 6.1%     Today (10/10/2024): Robert [name] is here for a follow up on diabetes management.  He has been checking glucose multiple times a day through freestyle libre. Pt has not been noted with any hypoglycemic episodes over the past 2 weeks.  Patient has been noted with weight loss, he is not very happy with the current weight loss No nausea  No constipation or diarrhea    HOME DIABETES REGIMEN:  Jardiance  10 mg, daily  Mounjaro  7.5 mg weekly    Statin: yes ACE-I/ARB: no   CONTINUOUS GLUCOSE MONITORING RECORD INTERPRETATION    Dates of Recording: 10/11-10/24/2025  Sensor  description:freestyle libre  Results statistics:   CGM use % of time 93  Average and SD 121/20  Time in range 98%  % Time Above 180 2  % Time above 250 0  % Time Below target 0   Glycemic patterns summary: BGs are optimal throughout the day and night Hyperglycemic episodes rarely postprandial  Hypoglycemic episodes occurred rarely at variable times  Overnight periods: Optimal    DIABETIC COMPLICATIONS: Microvascular complications:  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 Social History:  reports that he has never smoked. He has never used smokeless tobacco. He reports that he does not drink alcohol and does not use drugs. Family History:  Family History  Problem Relation Age of Onset   Hypertension Other    Asthma Son    Cancer Neg Hx    Diabetes Neg Hx    Early death Neg Hx    Heart disease Neg Hx    Hyperlipidemia Neg Hx    Kidney disease Neg Hx    Stroke Neg Hx      HOME MEDICATIONS: Allergies as of 10/10/2024   No Known Allergies  Medication List        Accurate as of October 10, 2024  7:50 AM. If you have any questions, ask your nurse or doctor.          Accu-Chek Guide Me w/Device Kit 1 Act by Does not apply route in the morning, at noon, and at bedtime.   alfuzosin  10 MG 24 hr tablet Commonly known as: UROXATRAL  TAKE 1 TABLET DAILY WITH  BREAKFAST   aspirin  EC 81 MG tablet Take 1 tablet (81 mg total) by mouth daily.   empagliflozin  10 MG Tabs tablet Commonly known as: Jardiance  Take 1 tablet (10 mg total) by mouth daily before breakfast.   FreeStyle Libre 3 Plus Sensor Misc 1 Device by Other route every 14 (fourteen) days. Change sensor every 15  days.   rosuvastatin  5 MG tablet Commonly known as: CRESTOR  Take 1 tablet (5 mg total) by mouth daily.   tadalafil  5 MG tablet Commonly known as: CIALIS  Take 1 tablet (5 mg total) by mouth daily.   tirzepatide  7.5 MG/0.5ML Pen Commonly known as: MOUNJARO  Inject 7.5 mg into the skin once a week.         OBJECTIVE:   Vital Signs: BP 110/80   Pulse 89   Ht 6' 4 (1.93 m)   Wt 219 lb (99.3 kg)   SpO2 98%   BMI 26.66 kg/m   Wt Readings from Last 3 Encounters:  10/10/24 219 lb (99.3 kg)  06/09/24 224 lb (101.6 kg)  02/01/24 247 lb (112 kg)     Exam: General: Pt appears well and is in NAD  Lungs: Clear with good BS bilat   Heart: RRR  Abdomen: Soft, nontender  Extremities: No pretibial edema  Neuro: MS is good with appropriate affect, pt is alert and Ox3      DM foot exam: 10/10/2024     The skin of the feet is intact without sores or ulcerations. The pedal pulses are 2+ on right and 2+ on left. The sensation is intact to a screening 5.07, 10 gram monofilament bilaterally     DATA REVIEWED:  Lab Results  Component Value Date   HGBA1C 5.7 (A) 10/10/2024   HGBA1C 6.1 (A) 06/09/2024   HGBA1C 6.0 (A) 02/01/2024    Latest Reference Range & Units 10/10/24 08:08  Sodium 135 - 146 mmol/L 139  Potassium 3.5 - 5.3 mmol/L 4.0  Chloride 98 - 110 mmol/L 104  CO2 20 - 32 mmol/L 27  Glucose 65 - 99 mg/dL 895 (H)  BUN 7 - 25 mg/dL 20  Creatinine 9.39 - 8.70 mg/dL 8.58 (H)  Calcium  8.6 - 10.3 mg/dL 9.4  BUN/Creatinine Ratio 6 - 22 (calc) 14  eGFR > OR = 60 mL/min/1.20m2 63  Total CHOL/HDL Ratio <5.0 (calc) 2.5  Cholesterol <200 mg/dL 96  HDL Cholesterol > OR = 40 mg/dL 39 (L)  LDL Cholesterol (Calc) mg/dL (calc) 44  MICROALB/CREAT RATIO <30 mg/g creat NOTE  Non-HDL Cholesterol (Calc) <130 mg/dL (calc) 57  Triglycerides <150 mg/dL 57    Latest Reference Range & Units 10/10/24 08:08  Microalb, Ur mg/dL <9.7  MICROALB/CREAT RATIO <30 mg/g creat NOTE   Creatinine, Urine 20 - 320 mg/dL 823     ASSESSMENT / PLAN / RECOMMENDATIONS:     1) Type 2 Diabetes Mellitus, Optimally  controlled, With microalbuminuria  complications - Most recent A1c of 5.7 %. Goal A1c < 7.0 %.   -A1c continues to be optimal - Intolerant to higher doses of  Farxiga , which had to be switched to Jardiance  due to insurance requirements in July, 2025 -Discontinued metformin  due to GI side effects in July, 2025 -Discontinued Lantus  due to hypoglycemia in July, 2025 - Patient is not keen on weight loss, he is not very satisfied with the current weight, I did explain to the patient that the more weight he regains, the more insulin  resistance he will become and he will be noted with hypoglycemia and we may have to increase his glycemic agents - I will decrease Mounjaro  at this time to prevent further weight loss, patient encouraged to start weight training   MEDICATIONS: -Continue Jardiance  10 mg, 1 tablet with Breakfast  - Decrease Mounjaro  5 mg weekly   EDUCATION / INSTRUCTIONS: BG monitoring instructions: Patient is instructed to check his blood sugars 3 times a day, before meals  Call [facility] if: BG persistently < 70  I reviewed the Rule of 15 for the treatment of hypoglycemia in detail with the patient. Literature supplied.    2) Diabetic complications:  Eye: Does not have known diabetic retinopathy.  Neuro/ Feet: Does not have known diabetic peripheral neuropathy. Renal: Patient does not have known baseline CKD. He is not on an ACEI/ARB at present.     3) Dyslipidemia :    -Patient has been on rosuvastatin  since 07/2020  - LDL at goal   Medication Continue rosuvastatin  5 mg daily  F/U in 6 months    Signed electronically by: Stefano Redgie Butts, MD  [facility] Endocrinology  [facility] [address] [place] [place], [place] [address] Phone: [phone] FAX: [phone]   CC: Robert Debby CROME,  MD [address] [place] [address] Phone: [phone]  Fax: [phone]  Return to Endocrinology clinic as below: No future appointments.

## 2024-10-11 LAB — MICROALBUMIN / CREATININE URINE RATIO
Creatinine, Urine: 176 mg/dL (ref 20–320)
Microalb, Ur: <0.2 mg/dL

## 2024-10-11 LAB — BASIC METABOLIC PANEL WITH GFR
BUN/Creatinine Ratio: 14 (calc) (ref 6–22)
BUN: 20 mg/dL (ref 7–25)
CO2: 27 mmol/L (ref 20–32)
Calcium: 9.4 mg/dL (ref 8.6–10.3)
Chloride: 104 mmol/L (ref 98–110)
Creat: 1.41 mg/dL — ABNORMAL HIGH (ref 0.60–1.29)
Glucose, Bld: 104 mg/dL — ABNORMAL HIGH (ref 65–99)
Potassium: 4 mmol/L (ref 3.5–5.3)
Sodium: 139 mmol/L (ref 135–146)
eGFR: 63 mL/min/1.73m2 (ref >=60)

## 2024-10-11 LAB — LIPID PANEL
Cholesterol: 96 mg/dL (ref <200)
HDL: 39 mg/dL — ABNORMAL LOW (ref >=40)
LDL Cholesterol (Calc): 44 mg/dL
Non-HDL Cholesterol (Calc): 57 mg/dL (ref <130)
Total CHOL/HDL Ratio: 2.5 (calc) (ref <5.0)
Triglycerides: 57 mg/dL (ref <150)

## 2024-10-13 ENCOUNTER — Ambulatory Visit: Payer: Self-pay | Admitting: Internal Medicine

## 2024-12-22 ENCOUNTER — Encounter: Payer: Self-pay | Admitting: Internal Medicine

## 2024-12-22 ENCOUNTER — Other Ambulatory Visit (HOSPITAL_COMMUNITY): Payer: Self-pay

## 2024-12-22 DIAGNOSIS — E785 Hyperlipidemia, unspecified: Secondary | ICD-10-CM

## 2024-12-22 MED ORDER — EMPAGLIFLOZIN 10 MG PO TABS
10.0000 mg | ORAL_TABLET | Freq: Every day | ORAL | 3 refills | Status: AC
  Filled 2024-12-22 – 2025-01-17 (×2): qty 90, 90d supply, fill #0
  Filled 2025-01-22: qty 30, 30d supply, fill #0

## 2024-12-22 MED ORDER — FREESTYLE LIBRE 3 PLUS SENSOR MISC
1.0000 | 11 refills | Status: AC
  Filled 2024-12-22 – 2025-01-22 (×2): qty 2, 30d supply, fill #0

## 2024-12-22 MED ORDER — TIRZEPATIDE 5 MG/0.5ML ~~LOC~~ SOAJ
5.0000 mg | SUBCUTANEOUS | 3 refills | Status: AC
  Filled 2024-12-22: qty 2, 28d supply, fill #0
  Filled 2025-01-17: qty 2, 30d supply, fill #0
  Filled 2025-01-22: qty 2, 28d supply, fill #0

## 2024-12-22 MED ORDER — ROSUVASTATIN CALCIUM 5 MG PO TABS
5.0000 mg | ORAL_TABLET | Freq: Every day | ORAL | 3 refills | Status: AC
  Filled 2024-12-22 – 2025-01-01 (×2): qty 30, 30d supply, fill #0
  Filled 2025-01-17: qty 90, 90d supply, fill #0
  Filled 2025-01-22: qty 30, 30d supply, fill #0

## 2024-12-23 ENCOUNTER — Other Ambulatory Visit (HOSPITAL_COMMUNITY): Payer: Self-pay

## 2025-01-01 ENCOUNTER — Other Ambulatory Visit (HOSPITAL_COMMUNITY): Payer: Self-pay

## 2025-01-02 ENCOUNTER — Other Ambulatory Visit (HOSPITAL_COMMUNITY): Payer: Self-pay

## 2025-01-17 ENCOUNTER — Other Ambulatory Visit (HOSPITAL_COMMUNITY): Payer: Self-pay

## 2025-01-18 ENCOUNTER — Other Ambulatory Visit: Payer: Self-pay

## 2025-01-19 ENCOUNTER — Other Ambulatory Visit: Payer: Self-pay

## 2025-01-19 ENCOUNTER — Other Ambulatory Visit (HOSPITAL_COMMUNITY): Payer: Self-pay

## 2025-01-22 ENCOUNTER — Other Ambulatory Visit (HOSPITAL_COMMUNITY): Payer: Self-pay

## 2025-01-22 ENCOUNTER — Other Ambulatory Visit: Payer: Self-pay

## 2025-04-10 ENCOUNTER — Ambulatory Visit: Admitting: Internal Medicine
